# Patient Record
Sex: Female | Born: 1995 | Hispanic: Yes | Marital: Single | Smoking: Current every day smoker
Health system: Southern US, Community
[De-identification: ages and names within clinical notes are randomized; demographics above are authoritative.]

## PROBLEM LIST (undated history)

## (undated) DIAGNOSIS — E119 Type 2 diabetes mellitus without complications: Secondary | ICD-10-CM

## (undated) DIAGNOSIS — J343 Hypertrophy of nasal turbinates: Secondary | ICD-10-CM

## (undated) HISTORY — PX: TURBINATE REDUCTION: SHX6157

## (undated) HISTORY — PX: ADENOIDECTOMY: SUR15

---
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
## 2014-12-11 ENCOUNTER — Ambulatory Visit (INDEPENDENT_AMBULATORY_CARE_PROVIDER_SITE_OTHER): Payer: Medicaid Other | Admitting: Otolaryngology

## 2014-12-11 DIAGNOSIS — J310 Chronic rhinitis: Secondary | ICD-10-CM

## 2015-01-22 ENCOUNTER — Ambulatory Visit (INDEPENDENT_AMBULATORY_CARE_PROVIDER_SITE_OTHER): Payer: Medicaid Other | Admitting: Otolaryngology

## 2015-01-22 DIAGNOSIS — J310 Chronic rhinitis: Secondary | ICD-10-CM

## 2015-09-14 LAB — HEMOGLOBIN A1C: Hemoglobin A1C: 9.2

## 2015-09-15 ENCOUNTER — Other Ambulatory Visit: Payer: Self-pay | Admitting: *Deleted

## 2015-09-15 ENCOUNTER — Telehealth: Payer: Self-pay | Admitting: Family Medicine

## 2015-09-15 ENCOUNTER — Ambulatory Visit (INDEPENDENT_AMBULATORY_CARE_PROVIDER_SITE_OTHER): Payer: Medicaid Other | Admitting: Family Medicine

## 2015-09-15 ENCOUNTER — Encounter: Payer: Self-pay | Admitting: Family Medicine

## 2015-09-15 VITALS — BP 137/92 | HR 81 | Temp 98.3°F | Ht 67.0 in | Wt 294.2 lb

## 2015-09-15 DIAGNOSIS — E119 Type 2 diabetes mellitus without complications: Secondary | ICD-10-CM | POA: Diagnosis not present

## 2015-09-15 DIAGNOSIS — R739 Hyperglycemia, unspecified: Secondary | ICD-10-CM

## 2015-09-15 LAB — URINALYSIS
Bilirubin, UA: NEGATIVE
LEUKOCYTES UA: NEGATIVE
Nitrite, UA: NEGATIVE
PH UA: 5.5 (ref 5.0–7.5)
RBC, UA: NEGATIVE
Specific Gravity, UA: >1.03 — ABNORMAL HIGH (ref 1.005–1.030)
Urobilinogen, Ur: 0.2 mg/dL (ref 0.2–1.0)

## 2015-09-15 LAB — GLUCOSE HEMOCUE WAIVED: Glu Hemocue Waived: 273 mg/dL — ABNORMAL HIGH (ref 65–99)

## 2015-09-15 MED ORDER — INSULIN DEGLUDEC 100 UNIT/ML ~~LOC~~ SOPN: 10.0000 [IU] | PEN_INJECTOR | Freq: Once | SUBCUTANEOUS | Status: DC

## 2015-09-15 MED ORDER — GLUCOSE BLOOD VI STRP: ORAL_STRIP | Status: DC

## 2015-09-15 NOTE — Telephone Encounter (Signed)
Patient given an appointment for today @ 1:55pm. Records requested form [facility].

## 2015-09-15 NOTE — Progress Notes (Signed)
Subjective:  Patient ID: Shelley Baldwin, female    DOB: 1995/09/25  Age: 20 y.o. MRN: [id number]  CC: Hospitalization Follow-up   HPI DENIELLE BAYARD presents forFollow-up of diabetes. Seen in E[facility] At [facility] yesterday and noted to have elevated glucose. Had AbG Showing resp. Acidosis ,and 3+ ketosis noted in urine. Patient never dxed prior. Glucose was 270s. HbA1c was 9.2. She was sent home with scrip for metformin 500 mg BID and ondansetron for nausea.  Pt. Sates she is determined to lose weight and cure her diabetes. To start with this she is going to cut out carb related snacks. She likes sweets and starches as well as rice.     History Roschelle has a past medical history of Nasal turbinate hypertrophy (07/2013).   She has past surgical history that includes Adenoidectomy; Turbinate reduction; and Turbinate reduction (Bilateral, 08/05/2013).   Her family history includes Diabetes in her mother.She reports that she has been smoking Cigarettes.  She has smoked for the past 2 years. She has never used smokeless tobacco. She reports that she drinks alcohol. She reports that she does not use illicit drugs.  No current outpatient prescriptions on file prior to visit.   No current facility-administered medications on file prior to visit.    ROS Review of Systems  Constitutional: Negative for fever, activity change and appetite change.  HENT: Negative for congestion, rhinorrhea and sore throat.   Eyes: Negative for visual disturbance.  Respiratory: Negative for cough and shortness of breath.   Cardiovascular: Negative for chest pain and palpitations.  Gastrointestinal: Negative for nausea, abdominal pain and diarrhea.  Genitourinary: Negative for dysuria.  Musculoskeletal: Negative for myalgias and arthralgias.    Objective:  BP 137/92 mmHg  Pulse 81  Temp(Src) 98.3 F (36.8 C) (Oral)  Ht 5' 7"  (1.702 m)  Wt 294 lb 3.2 oz (133.448 kg)  BMI 46.07 kg/m2  SpO2 99%  LMP  08/04/2015  BP Readings from Last 3 Encounters:  09/15/15 137/92  08/05/13 131/87  05/24/13 119/65    Wt Readings from Last 3 Encounters:  09/15/15 294 lb 3.2 oz (133.448 kg)  08/05/13 243 lb 6.4 oz (110.406 kg) (99 %*, Z = 2.41)  03/29/13 233 lb (105.688 kg) (99 %*, Z = 2.32)   * Growth percentiles are based on CDC 2-20 Years data.     Physical Exam  Constitutional: She is oriented to person, place, and time. She appears well-developed and well-nourished. No distress.  HENT:  Head: Normocephalic and atraumatic.  Right Ear: External ear normal.  Left Ear: External ear normal.  Nose: Nose normal.  Mouth/Throat: Oropharynx is clear and moist.  Eyes: Conjunctivae and EOM are normal. Pupils are equal, round, and reactive to light.  Neck: Normal range of motion. Neck supple. No thyromegaly present.  Cardiovascular: Normal rate, regular rhythm and normal heart sounds.   No murmur heard. Pulmonary/Chest: Effort normal and breath sounds normal. No respiratory distress. She has no wheezes. She has no rales.  Abdominal: Soft. Bowel sounds are normal. She exhibits no distension. There is no tenderness.  Lymphadenopathy:    She has no cervical adenopathy.  Neurological: She is alert and oriented to person, place, and time. She has normal reflexes.  Skin: Skin is warm and dry.  Psychiatric: She has a normal mood and affect. Her behavior is normal. Judgment and thought content normal.    No results found for: HGBA1C  Lab Results  Component Value Date   HGB 14.0 08/05/2013  Assessment & Plan:   Patrcia was seen today for hospitalization follow-up.  Diagnoses and all orders for this visit:  Hyperglycemia -     Glucose Hemocue Waived -     Urinalysis -     CBC with Differential/Platelet -     CMP14+EGFR -     TSH -     Microalbumin / creatinine urine ratio -     C-peptide -     Glucose Hemocue Waived -     Insulin Degludec SOPN 10 Units; Inject 10 Units into the skin  once.  Diabetes mellitus without complication (HCC) -     Insulin Degludec SOPN 10 Units; Inject 10 Units into the skin once.    Results for orders placed or performed in visit on 09/15/15  Glucose Hemocue Waived  Result Value Ref Range   Glu Hemocue Waived 273 (H) 65 - 99 mg/dL  Urinalysis  Result Value Ref Range   Specific Gravity, UA >1.030 (H) 1.005 - 1.030   pH, UA 5.5 5.0 - 7.5   Color, UA Yellow Yellow   Appearance Ur Clear Clear   Leukocytes, UA Negative Negative   Protein, UA 1+ (A) Negative/Trace   Glucose, UA 2+ (A) Negative   Ketones, UA 3+ (A) Negative   RBC, UA Negative Negative   Bilirubin, UA Negative Negative   Urobilinogen, Ur 0.2 0.2 - 1.0 mg/dL   Nitrite, UA Negative Negative     I have discontinued Ms. Heinle's oxyCODONE-acetaminophen. I am also having her maintain her metFORMIN and ondansetron. We will continue to administer Insulin Degludec.  Meds ordered this encounter  Medications  . metFORMIN (GLUCOPHAGE) 500 MG tablet    Sig: Take 500 mg by mouth 2 (two) times daily with a meal.  . ondansetron (ZOFRAN-ODT) 4 MG disintegrating tablet    Sig: Take 4 mg by mouth every 8 (eight) hours as needed for nausea or vomiting.  . Insulin Degludec SOPN 10 Units    Sig:    45 min spent with pt. More than 1/2 of which was in counseling and education regarding DM, weight loss, carb counting and teaching of complex & simple carb  Containing foods. Shewas also instructed on proper glucose monitoring techniques and a monitor was dispensed.  Follow-up: Return in about 7 days (around 09/22/2015).  Claretta Fraise, M.D.

## 2015-09-16 LAB — MICROALBUMIN / CREATININE URINE RATIO
CREATININE, UR: 261.2 mg/dL
MICROALB/CREAT RATIO: 16.2 mg/g creat (ref 0.0–30.0)
MICROALBUM., U, RANDOM: 42.3 ug/mL

## 2015-09-17 ENCOUNTER — Telehealth: Payer: Self-pay | Admitting: Family Medicine

## 2015-09-17 LAB — C-PEPTIDE: C-Peptide: 3.5 ng/mL (ref 1.1–4.4)

## 2015-09-17 LAB — CMP14+EGFR
ALK PHOS: 120 IU/L — AB (ref 39–117)
ALT: 81 IU/L — ABNORMAL HIGH (ref 0–32)
AST: 56 IU/L — AB (ref 0–40)
Albumin/Globulin Ratio: 1.2 (ref 1.2–2.2)
Albumin: 4.1 g/dL (ref 3.5–5.5)
BILIRUBIN TOTAL: 0.4 mg/dL (ref 0.0–1.2)
BUN/Creatinine Ratio: 14 (ref 9–23)
BUN: 10 mg/dL (ref 6–20)
CHLORIDE: 93 mmol/L — AB (ref 96–106)
CO2: 22 mmol/L (ref 18–29)
CREATININE: 0.74 mg/dL (ref 0.57–1.00)
Calcium: 9.6 mg/dL (ref 8.7–10.2)
GFR calc Af Amer: 135 mL/min/{1.73_m2} (ref >59)
GFR calc non Af Amer: 117 mL/min/{1.73_m2} (ref >59)
GLOBULIN, TOTAL: 3.5 g/dL (ref 1.5–4.5)
GLUCOSE: 239 mg/dL — AB (ref 65–99)
Potassium: 3.6 mmol/L (ref 3.5–5.2)
SODIUM: 135 mmol/L (ref 134–144)
Total Protein: 7.6 g/dL (ref 6.0–8.5)

## 2015-09-17 LAB — CBC WITH DIFFERENTIAL/PLATELET
BASOS: 0 %
Basophils Absolute: 0 10*3/uL (ref 0.0–0.2)
EOS (ABSOLUTE): 0.1 10*3/uL (ref 0.0–0.4)
Eos: 2 %
Hematocrit: 39.8 % (ref 34.0–46.6)
Hemoglobin: 13.7 g/dL (ref 11.1–15.9)
IMMATURE GRANS (ABS): 0 10*3/uL (ref 0.0–0.1)
Immature Granulocytes: 0 %
Lymphocytes Absolute: 2.1 10*3/uL (ref 0.7–3.1)
Lymphs: 40 %
MCH: 29.5 pg (ref 26.6–33.0)
MCHC: 34.4 g/dL (ref 31.5–35.7)
MCV: 86 fL (ref 79–97)
MONOCYTES: 7 %
Monocytes Absolute: 0.4 10*3/uL (ref 0.1–0.9)
NEUTROS ABS: 2.7 10*3/uL (ref 1.4–7.0)
Neutrophils: 51 %
Platelets: 217 10*3/uL (ref 150–379)
RBC: 4.65 x10E6/uL (ref 3.77–5.28)
RDW: 13.5 % (ref 12.3–15.4)
WBC: 5.2 10*3/uL (ref 3.4–10.8)

## 2015-09-17 LAB — TSH: TSH: 2.18 u[IU]/mL (ref 0.450–4.500)

## 2015-09-17 MED ORDER — METFORMIN HCL 500 MG PO TABS: 500.0000 mg | ORAL_TABLET | Freq: Two times a day (BID) | ORAL | Status: DC

## 2015-09-17 MED ORDER — GLUCOSE BLOOD VI STRP: ORAL_STRIP | Status: DC

## 2015-09-17 NOTE — Telephone Encounter (Addendum)
Patient aware that I resent rx for metformin and strips to the pharmacy for her.

## 2015-09-19 ENCOUNTER — Telehealth: Payer: Self-pay | Admitting: Family Medicine

## 2015-09-19 ENCOUNTER — Other Ambulatory Visit: Payer: Self-pay

## 2015-09-19 MED ORDER — GLUCOSE BLOOD VI STRP: ORAL_STRIP | Status: DC

## 2015-09-19 MED ORDER — ACCU-CHEK MULTICLIX LANCETS MISC: Status: DC

## 2015-09-19 NOTE — Telephone Encounter (Signed)
done

## 2015-09-21 ENCOUNTER — Encounter: Payer: Self-pay | Admitting: *Deleted

## 2015-09-22 ENCOUNTER — Ambulatory Visit: Payer: Medicaid Other | Admitting: Family Medicine

## 2015-09-23 ENCOUNTER — Encounter: Payer: Self-pay | Admitting: Family Medicine

## 2015-09-24 ENCOUNTER — Ambulatory Visit (INDEPENDENT_AMBULATORY_CARE_PROVIDER_SITE_OTHER): Payer: Medicaid Other | Admitting: Family Medicine

## 2015-09-24 ENCOUNTER — Encounter: Payer: Self-pay | Admitting: Family Medicine

## 2015-09-24 VITALS — BP 131/75 | HR 83 | Temp 98.1°F | Ht 67.0 in | Wt 290.2 lb

## 2015-09-24 DIAGNOSIS — IMO0001 Reserved for inherently not codable concepts without codable children: Secondary | ICD-10-CM

## 2015-09-24 DIAGNOSIS — E1165 Type 2 diabetes mellitus with hyperglycemia: Secondary | ICD-10-CM | POA: Diagnosis not present

## 2015-09-24 LAB — GLUCOSE HEMOCUE WAIVED: Glu Hemocue Waived: 280 mg/dL — ABNORMAL HIGH (ref 65–99)

## 2015-09-24 MED ORDER — METFORMIN HCL 850 MG PO TABS: 850.0000 mg | ORAL_TABLET | Freq: Two times a day (BID) | ORAL | Status: DC

## 2015-09-24 MED ORDER — METFORMIN HCL ER 750 MG PO TB24: 750.0000 mg | ORAL_TABLET | Freq: Two times a day (BID) | ORAL | Status: DC

## 2015-09-24 NOTE — Addendum Note (Signed)
Addended by: Mechele ClaudeSTACKS, Kostantinos Tallman on: 09/24/2015 01:06 PM   Modules accepted: Orders, Medications

## 2015-09-24 NOTE — Progress Notes (Addendum)
Subjective:  Patient ID: Shelley Baldwin, female    DOB: 1996/03/03  Age: 20 y.o. MRN: [id number]  CC: Diabetes   HPI TERIANA DANKER presents forFollow-up of diabetes. Patient checks blood sugar at home.   270 fasting and 370 postprandial Patient denies symptoms such as polyuria, polydipsia, excessive hunger, nausea No significant hypoglycemic spells noted. Medications as noted below. Taking them regularlyOnly adverse effect is multiple loose BM daily.  History Pessy has a past medical history of Nasal turbinate hypertrophy (07/2013).   She has past surgical history that includes Adenoidectomy; Turbinate reduction; and Turbinate reduction (Bilateral, 08/05/2013).   Her family history includes Diabetes in her mother.She reports that she has been smoking Cigarettes.  She has smoked for the past 2 years. She has never used smokeless tobacco. She reports that she drinks alcohol. She reports that she does not use illicit drugs.  Current Outpatient Prescriptions on File Prior to Visit  Medication Sig Dispense Refill  . glucose blood test strip Check blood sugar one time daily and as needed 100 each 12  . Lancets (ACCU-CHEK MULTICLIX) lancets Use as instructed 100 each 2  . ondansetron (ZOFRAN-ODT) 4 MG disintegrating tablet Take 4 mg by mouth every 8 (eight) hours as needed for nausea or vomiting.    Marland Kitchen glucose blood (ACCU-CHEK GUIDE) test strip Use as instructed 100 each 2   Current Facility-Administered Medications on File Prior to Visit  Medication Dose Route Frequency Provider Last Rate Last Dose  . Insulin Degludec SOPN 10 Units  10 Units Subcutaneous Once Mechele Claude, MD        ROS Review of Systems  Constitutional: Negative for fever, activity change and appetite change.  HENT: Negative for congestion, rhinorrhea and sore throat.   Eyes: Negative for visual disturbance.  Respiratory: Negative for cough and shortness of breath.   Cardiovascular: Negative for chest pain and  palpitations.  Gastrointestinal: Negative for nausea, abdominal pain and diarrhea.  Genitourinary: Negative for dysuria.  Musculoskeletal: Negative for myalgias and arthralgias.    Objective:  BP 131/75 mmHg  Pulse 83  Temp(Src) 98.1 F (36.7 C) (Oral)  Ht  (1.702 m)  Wt 290 lb 3.2 oz (131.634 kg)  BMI 45.44 kg/m2  SpO2 98%  LMP 08/04/2015  BP Readings from Last 3 Encounters:  09/24/15 131/75  09/15/15 137/92  08/05/13 131/87    Wt Readings from Last 3 Encounters:  09/24/15 290 lb 3.2 oz (131.634 kg)  09/15/15 294 lb 3.2 oz (133.448 kg)  08/05/13 243 lb 6.4 oz (110.406 kg) (99 %*, Z = 2.41)   * Growth percentiles are based on CDC 2-20 Years data.     Physical Exam  Constitutional: She is oriented to person, place, and time. She appears well-developed and well-nourished. No distress.  HENT:  Head: Normocephalic and atraumatic.  Eyes: Conjunctivae are normal. Pupils are equal, round, and reactive to light.  Neck: Normal range of motion. Neck supple. No thyromegaly present.  Cardiovascular: Normal rate, regular rhythm and normal heart sounds.   No murmur heard. Pulmonary/Chest: Effort normal and breath sounds normal. No respiratory distress. She has no wheezes. She has no rales.  Abdominal: Soft. Bowel sounds are normal. She exhibits no distension. There is no tenderness.  Musculoskeletal: Normal range of motion.  Lymphadenopathy:    She has no cervical adenopathy.  Neurological: She is alert and oriented to person, place, and time.  Skin: Skin is warm and dry.  Psychiatric: She has a normal mood and  affect. Her behavior is normal. Judgment and thought content normal.    Lab Results  Component Value Date   HGBA1C 9.2 09/14/2015    Lab Results  Component Value Date   WBC 5.2 09/15/2015   HGB 14.0 08/05/2013   HCT 39.8 09/15/2015   PLT 217 09/15/2015   GLUCOSE 239* 09/15/2015   ALT 81* 09/15/2015   AST 56* 09/15/2015   NA 135 09/15/2015   K 3.6  09/15/2015   CL 93* 09/15/2015   CREATININE 0.74 09/15/2015   BUN 10 09/15/2015   CO2 22 09/15/2015   TSH 2.180 09/15/2015   HGBA1C 9.2 09/14/2015     Assessment & Plan:   [name] was seen today for diabetes.  Diagnoses and all orders for this visit:  Uncontrolled type 2 diabetes mellitus without complication, without long-term current use of insulin (HCC) -     Glucose Hemocue Waived  Other orders -     Discontinue: metFORMIN (GLUCOPHAGE) 850 MG tablet; Take 1 tablet (850 mg total) by mouth 2 (two) times daily with a meal. -     metFORMIN (GLUCOPHAGE-XR) 750 MG 24 hr tablet; Take 1 tablet (750 mg total) by mouth 2 (two) times daily.    Continue low carb "Lean and green" diet.  I have discontinued Ms. Cuny's metFORMIN and metFORMIN. I am also having her start on metFORMIN. Additionally, I am having her maintain her ondansetron, glucose blood, glucose blood, and accu-chek multiclix. We will continue to administer Insulin Degludec.  Meds ordered this encounter  Medications  . DISCONTD: metFORMIN (GLUCOPHAGE) 850 MG tablet    Sig: Take 1 tablet (850 mg total) by mouth 2 (two) times daily with a meal.    Dispense:  60 tablet    Refill:  2  . metFORMIN (GLUCOPHAGE-XR) 750 MG 24 hr tablet    Sig: Take 1 tablet (750 mg total) by mouth 2 (two) times daily.    Dispense:  60 tablet    Refill:  5    Replaces recently submitted scrip for Metformin 850 BID due to pt report of diarrhea     Follow-up: Return in about 1 week (around 10/01/2015).  Mechele ClaudeWarren Estefania Kamiya, M.D.

## 2015-10-05 ENCOUNTER — Ambulatory Visit: Payer: Medicaid Other | Admitting: Family Medicine

## 2015-10-08 ENCOUNTER — Ambulatory Visit: Payer: Medicaid Other | Admitting: Pediatrics

## 2015-10-09 ENCOUNTER — Encounter: Payer: Self-pay | Admitting: Family Medicine

## 2015-10-09 ENCOUNTER — Ambulatory Visit (INDEPENDENT_AMBULATORY_CARE_PROVIDER_SITE_OTHER): Payer: Medicaid Other | Admitting: Pediatrics

## 2015-10-09 ENCOUNTER — Encounter: Payer: Self-pay | Admitting: Pediatrics

## 2015-10-09 VITALS — BP 136/88 | HR 101 | Temp 98.5°F | Ht 67.0 in | Wt 284.8 lb

## 2015-10-09 DIAGNOSIS — Z975 Presence of (intrauterine) contraceptive device: Secondary | ICD-10-CM | POA: Diagnosis not present

## 2015-10-09 DIAGNOSIS — Z309 Encounter for contraceptive management, unspecified: Secondary | ICD-10-CM | POA: Diagnosis not present

## 2015-10-09 DIAGNOSIS — R030 Elevated blood-pressure reading, without diagnosis of hypertension: Secondary | ICD-10-CM

## 2015-10-09 DIAGNOSIS — E119 Type 2 diabetes mellitus without complications: Secondary | ICD-10-CM | POA: Diagnosis not present

## 2015-10-09 DIAGNOSIS — IMO0001 Reserved for inherently not codable concepts without codable children: Secondary | ICD-10-CM

## 2015-10-09 DIAGNOSIS — Z3009 Encounter for other general counseling and advice on contraception: Secondary | ICD-10-CM

## 2015-10-09 MED ORDER — SITAGLIPTIN PHOSPHATE 100 MG PO TABS: 100.0000 mg | ORAL_TABLET | Freq: Every day | ORAL | Status: DC

## 2015-10-09 NOTE — Patient Instructions (Signed)
 1000mg  metformin twice a day  100mg  of januvia once a day in the morning  Check  blood glucose level in the morning before eating anything and before dinner.

## 2015-10-09 NOTE — Progress Notes (Signed)
    Subjective:    Patient ID: Shelley Baldwin, female    DOB: 29-Mar-1996, 20 y.o.   MRN: [id number]  CC: Follow-up diabetes  HPI: Shelley Baldwin is a 20 y.o. female presenting for Follow-up  DM2: started on metformin 5-6 weeks ago.  Range from 200s-300s. No lows Feeling well Checks BGL multiple times a day  Interested in losing weight Has been making lifestyle changes such as increased activity, cutting out sugary beverages  On birth control, mirena IUD, needs to have replaced this fall. Dec 2013 was placed.   [facility] 10/09/2015 09/24/2015 09/15/2015  Decreased Interest 0 0 0  Down, Depressed, Hopeless 0 0 0  PHQ - 2 Score 0 0 0     Relevant past medical, surgical, family and social history reviewed and updated as indicated.  Interim medical history since our last visit reviewed. Allergies and medications reviewed and updated.  ROS: Per HPI unless specifically indicated above  History  Smoking status  . Current Every Day Smoker -- 2 years  . Types: Cigarettes  Smokeless tobacco  . Never Used    Comment: 5-6 cig./day       Objective:    BP 136/88 mmHg  Pulse 101  Temp(Src) 98.5 F (36.9 C) (Oral)  Ht 5\' 7"  (1.702 m)  Wt 284 lb 12.8 oz (129.184 kg)  BMI 44.60 kg/m2  LMP 08/04/2015  Wt Readings from Last 3 Encounters:  10/09/15 284 lb 12.8 oz (129.184 kg)  09/24/15 290 lb 3.2 oz (131.634 kg)  09/15/15 294 lb 3.2 oz (133.448 kg)     Gen: NAD, alert, cooperative with exam, NCAT EYES: EOMI, no scleral injection or icterus CV: NRRR, normal S1/S2, no murmur, distal pulses 2+ b/l Resp: CTABL, no wheezes, normal WOB Ext: No edema, warm Neuro: Alert and oriented      Assessment & Plan:    [name] was seen today for follow-up multiple med problems.  Diagnoses and all orders for this visit:  Type 2 diabetes mellitus without complication, without long-term current use of insulin (HCC) BGLs still elevated, none below 250 at home Start  below Discussed needs to talk with doctors before trying to get pregnant If still not controlled with below and metformin 1000mg  BID, will ass basal insulin -     sitaGLIPtin (JANUVIA) 100 MG tablet; Take 1 tablet (100 mg total) by mouth daily.  Elevated blood pressure Will recheck next visit Trying lifestyle changes Start med next visit if needed  Morbid obesity, unspecified obesity type (HCC) Cont lifestyle changes  Birth control counseling Completed, given new diagnosis, new meds  IUD (intrauterine device) in place Getting replaced in next few months    Follow up plan: Return in about 2 weeks (around 10/23/2015).  Rex Krasarol Corby Vandenberghe, MD [facility] 10/09/2015, 4:46 PM

## 2015-10-12 DIAGNOSIS — R030 Elevated blood-pressure reading, without diagnosis of hypertension: Secondary | ICD-10-CM

## 2015-10-12 DIAGNOSIS — E119 Type 2 diabetes mellitus without complications: Secondary | ICD-10-CM | POA: Insufficient documentation

## 2015-10-12 DIAGNOSIS — Z975 Presence of (intrauterine) contraceptive device: Secondary | ICD-10-CM | POA: Insufficient documentation

## 2015-10-12 DIAGNOSIS — IMO0001 Reserved for inherently not codable concepts without codable children: Secondary | ICD-10-CM | POA: Insufficient documentation

## 2015-10-12 DIAGNOSIS — Z720 Tobacco use: Secondary | ICD-10-CM | POA: Insufficient documentation

## 2015-10-26 ENCOUNTER — Ambulatory Visit: Payer: Medicaid Other | Admitting: Pediatrics

## 2015-11-02 ENCOUNTER — Encounter: Payer: Self-pay | Admitting: Pediatrics

## 2015-11-02 ENCOUNTER — Ambulatory Visit (INDEPENDENT_AMBULATORY_CARE_PROVIDER_SITE_OTHER): Payer: Medicaid Other | Admitting: Pediatrics

## 2015-11-02 VITALS — BP 141/89 | HR 101 | Temp 98.5°F | Ht 67.0 in | Wt 277.0 lb

## 2015-11-02 DIAGNOSIS — Z720 Tobacco use: Secondary | ICD-10-CM | POA: Diagnosis not present

## 2015-11-02 DIAGNOSIS — E119 Type 2 diabetes mellitus without complications: Secondary | ICD-10-CM | POA: Diagnosis not present

## 2015-11-02 DIAGNOSIS — R030 Elevated blood-pressure reading, without diagnosis of hypertension: Secondary | ICD-10-CM | POA: Diagnosis not present

## 2015-11-02 DIAGNOSIS — IMO0001 Reserved for inherently not codable concepts without codable children: Secondary | ICD-10-CM

## 2015-11-02 LAB — BAYER DCA HB A1C WAIVED: HB A1C (BAYER DCA - WAIVED): 11.9 % — ABNORMAL HIGH (ref <7.0)

## 2015-11-02 MED ORDER — INSULIN PEN NEEDLE 31G X 4 MM MISC: 1.0000 "application " | Freq: Every day | 1 refills | Status: DC

## 2015-11-02 MED ORDER — INSULIN GLARGINE 100 UNITS/ML SOLOSTAR PEN: 10.0000 [IU] | PEN_INJECTOR | Freq: Every day | SUBCUTANEOUS | Status: DC

## 2015-11-02 NOTE — Progress Notes (Signed)
    Subjective:    Patient ID: Shelley Baldwin, female    DOB: 05/02/1995, 20 y.o.   MRN: [id number]  CC: Diabetes (2 week rck)   HPI: Shelley Baldwin is a 20 y.o. female presenting for Diabetes (2 week rck)  300s in the morning 250-310 in the morning 1000mg  metformin BID januvia 100mg  in the morning No side effects   No headaches or vision changes Has given insulin to family members before Feeling better, more energy, than a few weeks ago  Continues to smoke regularly   Depression screen [facility] 2/9 11/02/2015 10/09/2015 09/24/2015 09/15/2015  Decreased Interest 0 0 0 0  Down, Depressed, Hopeless 0 0 0 0  PHQ - 2 Score 0 0 0 0     Relevant past medical, surgical, family and social history reviewed and updated as indicated.  Interim medical history since our last visit reviewed. Allergies and medications reviewed and updated.  ROS: Per HPI   History  Smoking Status  . Current Every Day Smoker  . Years: 2.00  . Types: Cigarettes  Smokeless Tobacco  . Never Used    Comment: 5-6 cig./day       Objective:    BP (!) 141/89 (BP Location: Right Arm, Patient Position: Sitting, Cuff Size: Large)   Pulse (!) 101   Temp 98.5 F (36.9 C) (Oral)   Ht 5\' 7"  (1.702 m)   Wt 277 lb (125.6 kg)   BMI 43.38 kg/m   Wt Readings from Last 3 Encounters:  11/02/15 277 lb (125.6 kg)  10/09/15 284 lb 12.8 oz (129.2 kg)  09/24/15 290 lb 3.2 oz (131.6 kg)     Gen: NAD, alert, cooperative with exam, NCAT EYES: EOMI, no scleral injection or icterus ENT:  OP without erythema CV: NRRR, normal S1/S2, no murmur, distal pulses 2+ b/l Resp: CTABL, no wheezes, normal WOB Ext: No edema, warm Neuro: Alert and oriented     Assessment & Plan:    Shelley Baldwin was seen today for diabetes.  Diagnoses and all orders for this visit:  Type 2 diabetes mellitus without complication, without long-term current use of insulin (HCC) HgA1c increased to 11 C-peptide 3.5, anti-gad not checked, will send Start  basal insulin Cont metformin, januvia Let me know if AM BGLs less than 100 Check at least before breakfast and dinner Bring to next clinic visit -     Bayer DCA Hb A1c Waived -     insulin glargine (LANTUS) Solostar Pen 10 Units; Inject 0.1 mLs (10 Units total) into the skin daily at 10 pm. -     Insulin Pen Needle (EXEL COMFORT POINT PEN NEEDLE) 31G X 4 MM MISC; 1 application by Does not apply route daily.  Elevated blood pressure Take BP at home  Continue to work on lifestyle changes Will need to start medicine if remains elevated  Tobacco abuse Continue to encourage cessation  BMI 43 Continue to decrease sugar intake, sodas Increase physical activity  Follow up plan: Return for 4 weeks appt with Tammy, 3 mo with MD.  Rex Kras, MD [facility] 11/02/2015, 5:03 PM

## 2015-11-04 ENCOUNTER — Telehealth: Payer: Self-pay | Admitting: Family Medicine

## 2015-11-05 ENCOUNTER — Other Ambulatory Visit: Payer: Medicaid Other

## 2015-11-05 ENCOUNTER — Other Ambulatory Visit: Payer: Self-pay | Admitting: Pediatrics

## 2015-11-05 DIAGNOSIS — E119 Type 2 diabetes mellitus without complications: Secondary | ICD-10-CM

## 2015-11-05 MED ORDER — INSULIN GLARGINE 100 UNIT/ML SOLOSTAR PEN: 10.0000 [IU] | PEN_INJECTOR | Freq: Every day | SUBCUTANEOUS | 2 refills | Status: DC

## 2015-11-05 NOTE — Telephone Encounter (Signed)
Fixed Rx problem. Pt coming by today for a lab draw, order in.

## 2015-11-06 ENCOUNTER — Other Ambulatory Visit: Payer: Self-pay | Admitting: Pediatrics

## 2015-11-06 DIAGNOSIS — E119 Type 2 diabetes mellitus without complications: Secondary | ICD-10-CM

## 2015-11-06 LAB — GLUTAMIC ACID DECARBOXYLASE AUTO ABS: Glutamic Acid Decarb Ab: <5 U/mL (ref 0.0–5.0)

## 2015-12-22 ENCOUNTER — Encounter (HOSPITAL_COMMUNITY): Payer: Self-pay | Admitting: Emergency Medicine

## 2015-12-22 ENCOUNTER — Emergency Department (HOSPITAL_COMMUNITY): Payer: Medicaid Other

## 2015-12-22 ENCOUNTER — Emergency Department (HOSPITAL_COMMUNITY)
Admission: EM | Admit: 2015-12-22 | Discharge: 2015-12-22 | Disposition: A | Discharge status: Home / Self Care | Payer: Medicaid Other | Attending: Emergency Medicine | Admitting: Emergency Medicine

## 2015-12-22 DIAGNOSIS — Z7984 Long term (current) use of oral hypoglycemic drugs: Secondary | ICD-10-CM | POA: Diagnosis not present

## 2015-12-22 DIAGNOSIS — E119 Type 2 diabetes mellitus without complications: Secondary | ICD-10-CM | POA: Diagnosis not present

## 2015-12-22 DIAGNOSIS — F17210 Nicotine dependence, cigarettes, uncomplicated: Secondary | ICD-10-CM | POA: Insufficient documentation

## 2015-12-22 DIAGNOSIS — J208 Acute bronchitis due to other specified organisms: Secondary | ICD-10-CM | POA: Diagnosis not present

## 2015-12-22 DIAGNOSIS — R05 Cough: Secondary | ICD-10-CM | POA: Diagnosis present

## 2015-12-22 DIAGNOSIS — Z794 Long term (current) use of insulin: Secondary | ICD-10-CM | POA: Diagnosis not present

## 2015-12-22 HISTORY — DX: Type 2 diabetes mellitus without complications: E11.9

## 2015-12-22 MED ORDER — AZITHROMYCIN 250 MG PO TABS: 250.0000 mg | ORAL_TABLET | Freq: Every day | ORAL | 0 refills | Status: DC

## 2015-12-22 MED ORDER — IPRATROPIUM-ALBUTEROL 0.5-2.5 (3) MG/3ML IN SOLN
3.0000 mL | Freq: Once | RESPIRATORY_TRACT | Status: AC
  Administered 2015-12-22: 3 mL via RESPIRATORY_TRACT
  Filled 2015-12-22: qty 3

## 2015-12-22 MED ORDER — ALBUTEROL SULFATE HFA 108 (90 BASE) MCG/ACT IN AERS
2.0000 | INHALATION_SPRAY | RESPIRATORY_TRACT | Status: DC
  Administered 2015-12-22: 2 via RESPIRATORY_TRACT
  Filled 2015-12-22: qty 6.7

## 2015-12-22 NOTE — ED Notes (Signed)
Rt at the bedside to give treatment and education done on inhaler

## 2015-12-22 NOTE — ED Provider Notes (Signed)
[facility]ER[facility]Y DEPT Provider Note   CSN: [id number] Arrival date & time: 12/22/15  1050     History   Chief Complaint Chief Complaint  Patient presents with  . Cough    HPI Shelley Baldwin is a 20 y.o. female.  The history is provided by the patient. No language interpreter was used.  Cough  This is a recurrent problem. The problem occurs constantly. The problem has been gradually worsening. The cough is productive of sputum. There has been no fever. Associated symptoms include sore throat. She has tried nothing for the symptoms. The treatment provided no relief. She is a smoker. Her past medical history is significant for bronchitis.    Past Medical History:  Diagnosis Date  . Diabetes mellitus without complication (HCC)   . Nasal turbinate hypertrophy 07/2013    Patient Active Problem List   Diagnosis Date Noted  . Type 2 diabetes mellitus without complication, without long-term current use of insulin (HCC) 10/12/2015  . Elevated blood pressure 10/12/2015  . Morbid obesity (HCC) 10/12/2015  . IUD (intrauterine device) in place 10/12/2015  . Tobacco abuse 10/12/2015    Past Surgical History:  Procedure Laterality Date  . ADENOIDECTOMY    . TURBINATE REDUCTION     x 3, per mother  . TURBINATE REDUCTION Bilateral 08/05/2013   Procedure: BILATERAL TURBINATE REDUCTION;  Surgeon: Darletta Moll, MD;  Location: [facility];  Service: ENT;  Laterality: Bilateral;    OB History    No data available       Home Medications    Prior to Admission medications   Medication Sig Start Date End Date Taking? Authorizing Provider  glucose blood (ACCU-CHEK GUIDE) test strip Use as instructed 09/19/15   Mechele Claude, MD  glucose blood test strip Check blood sugar one time daily and as needed 09/17/15   Mechele Claude, MD  Insulin Glargine (LANTUS SOLOSTAR) 100 UNIT/ML Solostar Pen Inject 10 Units into the skin daily at 10 pm. 11/05/15   Johna Sheriff, MD  Insulin Pen  Needle (EXEL COMFORT POINT PEN NEEDLE) 31G X 4 MM MISC 1 application by Does not apply route daily. 11/02/15   Johna Sheriff, MD  JANUVIA 100 MG tablet TAKE 1 TABLET (100 MG TOTAL) BY MOUTH DAILY. 11/06/15   Johna Sheriff, MD  Lancets (ACCU-CHEK MULTICLIX) lancets Use as instructed 09/19/15   Mechele Claude, MD  metFORMIN (GLUCOPHAGE) 500 MG tablet Take 1,000 mg by mouth 2 (two) times daily with a meal.  09/14/15   Historical Provider, MD  ondansetron (ZOFRAN-ODT) 4 MG disintegrating tablet Take 4 mg by mouth every 8 (eight) hours as needed for nausea or vomiting.    Historical Provider, MD    Family History Family History  Problem Relation Age of Onset  . Diabetes Mother     Social History Social History  Substance Use Topics  . Smoking status: Current Every Day Smoker    Years: 2.00    Types: Cigarettes  . Smokeless tobacco: Never Used     Comment: 5-6 cig./day  . Alcohol use Yes     Comment: occasionally     Allergies   Review of patient's allergies indicates no known allergies.   Review of Systems Review of Systems  HENT: Positive for sore throat.   Respiratory: Positive for cough.   All other systems reviewed and are negative.    Physical Exam Updated Vital Signs BP 155/90 (BP Location: Left Arm)   Pulse 99  Temp 100 F (37.8 C) (Temporal)   Resp 20   Ht 5\' 7"  (1.702 m)   Wt 122.5 kg   SpO2 100%   BMI 42.29 kg/m   Physical Exam  Constitutional: She appears well-developed and well-nourished. No distress.  HENT:  Head: Normocephalic and atraumatic.  Eyes: Conjunctivae are normal.  Neck: Neck supple.  Cardiovascular: Normal rate and regular rhythm.   No murmur heard. Pulmonary/Chest: Effort normal. No respiratory distress. She has wheezes.  Abdominal: Soft. There is no tenderness.  Musculoskeletal: She exhibits no edema.  Neurological: She is alert.  Skin: Skin is warm and dry.  Psychiatric: She has a normal mood and affect.  Nursing note and vitals  reviewed.    ED Treatments / Results  Labs (all labs ordered are listed, but only abnormal results are displayed) Labs Reviewed - No data to display  EKG  EKG Interpretation None       Radiology Dg Chest 2 View  Result Date: 12/22/2015 CLINICAL DATA:  20 year old female with cough for 1 week. Initial encounter. EXAM: CHEST  2 VIEW COMPARISON:  None. FINDINGS: The heart size and mediastinal contours are within normal limits. Both lungs are clear. Minimal curvature thoracic spine. IMPRESSION: No active cardiopulmonary disease. Electronically Signed   By: Lacy DuverneySteven  Olson M.D.   On: 12/22/2015 11:49    Procedures Procedures (including critical care time)  Medications Ordered in ED Medications  albuterol (PROVENTIL HFA;VENTOLIN HFA) 108 (90 Base) MCG/ACT inhaler 2 puff (2 puffs Inhalation Given 12/22/15 1202)  ipratropium-albuterol (DUONEB) 0.5-2.5 (3) MG/3ML nebulizer solution 3 mL (3 mLs Nebulization Given 12/22/15 1202)     Initial Impression / Assessment and Plan / ED Course  I have reviewed the triage vital signs and the nursing notes.  Pertinent labs & imaging results that were available during my care of the patient were reviewed by me and considered in my medical decision making (see chart for details).  Clinical Course    Chest xray no pneumonia.  Pt given albuterol inhaler.  rx for zithromax  Final Clinical Impressions(s) / ED Diagnoses   Final diagnoses:  Acute bronchitis due to other specified organisms    New Prescriptions New Prescriptions   No medications on file  An After Visit Summary was printed and given to the patient.   Lonia SkinnerLeslie K CourtlandSofia, PA-C 12/22/15 1246    Mancel BaleElliott Wentz, MD 12/22/15 1537

## 2015-12-22 NOTE — ED Triage Notes (Signed)
Pt c/o productive cough with light green sputum x 1 week. Pt c/o feeling worse and increasing sob today. Nad noted.

## 2015-12-22 NOTE — ED Notes (Signed)
Patient given discharge instruction, verbalized understand. Patient ambulatory out of the department.  

## 2015-12-27 ENCOUNTER — Encounter (HOSPITAL_COMMUNITY): Payer: Self-pay | Admitting: Emergency Medicine

## 2015-12-27 ENCOUNTER — Emergency Department (HOSPITAL_COMMUNITY)
Admission: EM | Admit: 2015-12-27 | Discharge: 2015-12-27 | Disposition: A | Discharge status: Home / Self Care | Payer: Medicaid Other | Attending: Emergency Medicine | Admitting: Emergency Medicine

## 2015-12-27 ENCOUNTER — Emergency Department (HOSPITAL_COMMUNITY): Payer: Medicaid Other

## 2015-12-27 DIAGNOSIS — F17210 Nicotine dependence, cigarettes, uncomplicated: Secondary | ICD-10-CM | POA: Insufficient documentation

## 2015-12-27 DIAGNOSIS — Z7984 Long term (current) use of oral hypoglycemic drugs: Secondary | ICD-10-CM | POA: Insufficient documentation

## 2015-12-27 DIAGNOSIS — R0602 Shortness of breath: Secondary | ICD-10-CM | POA: Diagnosis present

## 2015-12-27 DIAGNOSIS — Z794 Long term (current) use of insulin: Secondary | ICD-10-CM | POA: Insufficient documentation

## 2015-12-27 DIAGNOSIS — Z79899 Other long term (current) drug therapy: Secondary | ICD-10-CM | POA: Diagnosis not present

## 2015-12-27 DIAGNOSIS — J40 Bronchitis, not specified as acute or chronic: Secondary | ICD-10-CM | POA: Insufficient documentation

## 2015-12-27 DIAGNOSIS — E119 Type 2 diabetes mellitus without complications: Secondary | ICD-10-CM | POA: Diagnosis not present

## 2015-12-27 MED ORDER — PREDNISONE 10 MG PO TABS: 40.0000 mg | ORAL_TABLET | Freq: Every day | ORAL | 0 refills | Status: DC

## 2015-12-27 MED ORDER — CETIRIZINE HCL 10 MG PO TABS
10.0000 mg | ORAL_TABLET | Freq: Every day | ORAL | 3 refills | Status: DC
Start: 2015-12-27 — End: 2016-12-20

## 2015-12-27 NOTE — ED Provider Notes (Signed)
[facility] Provider Note   CSN: [id number] Arrival date & time: 12/27/15  1218     History   Chief Complaint Chief Complaint  Patient presents with  . Shortness of Breath    HPI Shelley Baldwin is a 20 y.o. female.  Patient seen 1 week ago with diagnosis of bronchitis started azithromycin and albuterol inhaler. Patient with persistent symptoms. Patient states that she's had trouble with cough and this time the cough is occasionally productive on and off since childhood. Has had several turbinate surgeries. Most recently 2015 by ear nose and throat doctor Teoh. Patient states that the treatments from a week ago have not helped at all. Patient denies any fevers. Denies a history of asthma or wheezing.      Past Medical History:  Diagnosis Date  . Diabetes mellitus without complication (HCC)   . Nasal turbinate hypertrophy 07/2013    Patient Active Problem List   Diagnosis Date Noted  . Type 2 diabetes mellitus without complication, without long-term current use of insulin (HCC) 10/12/2015  . Elevated blood pressure 10/12/2015  . Morbid obesity (HCC) 10/12/2015  . IUD (intrauterine device) in place 10/12/2015  . Tobacco abuse 10/12/2015    Past Surgical History:  Procedure Laterality Date  . ADENOIDECTOMY    . TURBINATE REDUCTION     x 3, per mother  . TURBINATE REDUCTION Bilateral 08/05/2013   Procedure: BILATERAL TURBINATE REDUCTION;  Surgeon: Darletta Moll, MD;  Location: [facility];  Service: ENT;  Laterality: Bilateral;    OB History    No data available       Home Medications    Prior to Admission medications   Medication Sig Start Date End Date Taking? Authorizing Provider  azithromycin (ZITHROMAX) 250 MG tablet Take 1 tablet (250 mg total) by mouth daily. Take first 2 tablets together, then 1 every day until finished. 12/22/15   Elson Areas, PA-C  cetirizine (ZYRTEC) 10 MG tablet Take 1 tablet (10 mg total) by mouth daily. 12/27/15    Vanetta Mulders, MD  glucose blood (ACCU-CHEK GUIDE) test strip Use as instructed 09/19/15   Mechele Claude, MD  glucose blood test strip Check blood sugar one time daily and as needed 09/17/15   Mechele Claude, MD  Insulin Glargine (LANTUS SOLOSTAR) 100 UNIT/ML Solostar Pen Inject 10 Units into the skin daily at 10 pm. 11/05/15   Johna Sheriff, MD  Insulin Pen Needle (EXEL COMFORT POINT PEN NEEDLE) 31G X 4 MM MISC 1 application by Does not apply route daily. 11/02/15   Johna Sheriff, MD  JANUVIA 100 MG tablet TAKE 1 TABLET (100 MG TOTAL) BY MOUTH DAILY. 11/06/15   Johna Sheriff, MD  Lancets (ACCU-CHEK MULTICLIX) lancets Use as instructed 09/19/15   Mechele Claude, MD  metFORMIN (GLUCOPHAGE) 500 MG tablet Take 1,000 mg by mouth 2 (two) times daily with a meal.  09/14/15   Historical Provider, MD  ondansetron (ZOFRAN-ODT) 4 MG disintegrating tablet Take 4 mg by mouth every 8 (eight) hours as needed for nausea or vomiting.    Historical Provider, MD  predniSONE (DELTASONE) 10 MG tablet Take 4 tablets (40 mg total) by mouth daily. 12/27/15   Vanetta Mulders, MD    Family History Family History  Problem Relation Age of Onset  . Diabetes Mother     Social History Social History  Substance Use Topics  . Smoking status: Current Every Day Smoker    Years: 2.00    Types:  Cigarettes  . Smokeless tobacco: Never Used     Comment: 5-6 cig./day  . Alcohol use Yes     Comment: occasionally     Allergies   Review of patient's allergies indicates no known allergies.   Review of Systems Review of Systems  Constitutional: Negative for fever.  HENT: Positive for congestion.   Eyes: Negative for itching.  Respiratory: Positive for cough. Negative for shortness of breath and wheezing.   Cardiovascular: Negative for chest pain.  Gastrointestinal: Negative for abdominal pain.  Genitourinary: Negative for dysuria.  Musculoskeletal: Negative for back pain.  Skin: Negative for rash.  Neurological:  Negative for headaches.  Hematological: Does not bruise/bleed easily.  Psychiatric/Behavioral: Negative for confusion.     Physical Exam Updated Vital Signs BP 134/81 (BP Location: Right Arm)   Pulse 82   Temp 98.2 F (36.8 C) (Oral)   Resp 18   Ht 5\' 7"  (1.702 m)   Wt 122.5 kg   SpO2 98%   BMI 42.29 kg/m   Physical Exam  Constitutional: She is oriented to person, place, and time. She appears well-developed and well-nourished. No distress.  HENT:  Head: Normocephalic and atraumatic.  Mouth/Throat: Oropharynx is clear and moist. No oropharyngeal exudate.  Eyes: Conjunctivae and EOM are normal. Pupils are equal, round, and reactive to light.  Neck: Normal range of motion. Neck supple.  Cardiovascular: Normal rate and regular rhythm.   Pulmonary/Chest: Effort normal and breath sounds normal. No respiratory distress. She has no wheezes.  Abdominal: Soft. Bowel sounds are normal. There is no tenderness.  Musculoskeletal: Normal range of motion.  Neurological: She is alert and oriented to person, place, and time. No cranial nerve deficit. She exhibits normal muscle tone. Coordination normal.  Skin: Skin is warm.  Nursing note and vitals reviewed.    ED Treatments / Results  Labs (all labs ordered are listed, but only abnormal results are displayed) Labs Reviewed - No data to display  EKG  EKG Interpretation  Date/Time:  Sunday December 27 2015 12:29:15 EDT Ventricular Rate:  82 PR Interval:  112 QRS Duration: 92 QT Interval:  376 QTC Calculation: 439 R Axis:   -13 Text Interpretation:  Sinus rhythm with marked sinus arrhythmia Minimal voltage criteria for LVH, may be normal variant T wave abnormality, consider anterior ischemia Abnormal ECG No previous ECGs available Confirmed by Lorann Tani  MD, Rumaysa Sabatino [phone]([id number]) on 12/27/2015 2:15:04 PM       Radiology Dg Chest 2 View  Result Date: 12/27/2015 CLINICAL DATA:  Patient with cough and shortness of breath. EXAM: CHEST  2  VIEW COMPARISON:  Chest radiograph 12/22/2015. FINDINGS: The heart size and mediastinal contours are within normal limits. Both lungs are clear. The visualized skeletal structures are unremarkable. IMPRESSION: No active cardiopulmonary disease. Electronically Signed   By: Annia Beltrew  Davis M.D.   On: 12/27/2015 14:28    Procedures Procedures (including critical care time)  Medications Ordered in ED Medications - No data to display   Initial Impression / Assessment and Plan / ED Course  I have reviewed the triage vital signs and the nursing notes.  Pertinent labs & imaging results that were available during my care of the patient were reviewed by me and considered in my medical decision making (see chart for details).  Clinical Course   Patient describes sort of a chronic bronchitis. States not due to allergies which she's had it since childhood. His had several turbinate surgeries done by ear nose and throat. Most recently in  2015 by Dr. Suszanne Conners.   Chest x-ray negative again today.  We'll give patient a trial of a long-acting antihistamine and if that doesn't work she can switch over to prednisone. Both referred to delay on the prednisone since she is diabetic. Also gave her referral back to ear nose and throat. Patient nontoxic no acute distress.  Patient will continue the albuterol inhalers. Final Clinical Impressions(s) / ED Diagnoses   Final diagnoses:  Bronchitis    New Prescriptions New Prescriptions   CETIRIZINE (ZYRTEC) 10 MG TABLET    Take 1 tablet (10 mg total) by mouth daily.   PREDNISONE (DELTASONE) 10 MG TABLET    Take 4 tablets (40 mg total) by mouth daily.     Vanetta Mulders, MD 12/27/15 1529

## 2015-12-27 NOTE — ED Triage Notes (Signed)
Patient c/o shortness of breath with cough. Per patient only occasionally productive. Patient states seen here in ER a week and a half ago for same reason and had a chest x-ray. Patient states "They didn't see anything but told me to return if it got worse." Patient unsure of any fevers.

## 2015-12-27 NOTE — Discharge Instructions (Signed)
Continue to use albuterol inhaler every 6 hours. Trial of the Zyrtec for 1 week. If it does not help then try the prednisone as directed. But this will bump her blood sugar. Make an appointment to follow up with ear nose and throat.

## 2016-01-04 ENCOUNTER — Ambulatory Visit (INDEPENDENT_AMBULATORY_CARE_PROVIDER_SITE_OTHER): Payer: Medicaid Other | Admitting: Otolaryngology

## 2016-01-04 DIAGNOSIS — R05 Cough: Secondary | ICD-10-CM

## 2016-01-05 ENCOUNTER — Emergency Department (HOSPITAL_COMMUNITY)
Admission: EM | Admit: 2016-01-05 | Discharge: 2016-01-05 | Disposition: A | Discharge status: Home / Self Care | Payer: Medicaid Other | Attending: Emergency Medicine | Admitting: Emergency Medicine

## 2016-01-05 ENCOUNTER — Encounter (HOSPITAL_COMMUNITY): Payer: Self-pay | Admitting: Cardiology

## 2016-01-05 ENCOUNTER — Emergency Department (HOSPITAL_COMMUNITY): Payer: Medicaid Other

## 2016-01-05 DIAGNOSIS — N390 Urinary tract infection, site not specified: Secondary | ICD-10-CM | POA: Diagnosis not present

## 2016-01-05 DIAGNOSIS — R739 Hyperglycemia, unspecified: Secondary | ICD-10-CM

## 2016-01-05 DIAGNOSIS — R05 Cough: Secondary | ICD-10-CM | POA: Diagnosis not present

## 2016-01-05 DIAGNOSIS — F17210 Nicotine dependence, cigarettes, uncomplicated: Secondary | ICD-10-CM | POA: Insufficient documentation

## 2016-01-05 DIAGNOSIS — E1165 Type 2 diabetes mellitus with hyperglycemia: Secondary | ICD-10-CM | POA: Insufficient documentation

## 2016-01-05 DIAGNOSIS — Z79899 Other long term (current) drug therapy: Secondary | ICD-10-CM | POA: Diagnosis not present

## 2016-01-05 DIAGNOSIS — Z7984 Long term (current) use of oral hypoglycemic drugs: Secondary | ICD-10-CM | POA: Insufficient documentation

## 2016-01-05 DIAGNOSIS — Z794 Long term (current) use of insulin: Secondary | ICD-10-CM | POA: Insufficient documentation

## 2016-01-05 LAB — CBC WITH DIFFERENTIAL/PLATELET
Basophils Absolute: 0 10*3/uL (ref 0.0–0.1)
Basophils Relative: 0 %
Eosinophils Absolute: 0 10*3/uL (ref 0.0–0.7)
Eosinophils Relative: 1 %
HCT: 38.3 % (ref 36.0–46.0)
HEMOGLOBIN: 13 g/dL (ref 12.0–15.0)
LYMPHS ABS: 1.6 10*3/uL (ref 0.7–4.0)
LYMPHS PCT: 32 %
MCH: 29.7 pg (ref 26.0–34.0)
MCHC: 33.9 g/dL (ref 30.0–36.0)
MCV: 87.6 fL (ref 78.0–100.0)
Monocytes Absolute: 0.3 10*3/uL (ref 0.1–1.0)
Monocytes Relative: 6 %
NEUTROS ABS: 3 10*3/uL (ref 1.7–7.7)
NEUTROS PCT: 61 %
Platelets: 166 10*3/uL (ref 150–400)
RBC: 4.37 MIL/uL (ref 3.87–5.11)
RDW: 13 % (ref 11.5–15.5)
WBC: 5 10*3/uL (ref 4.0–10.5)

## 2016-01-05 LAB — URINE MICROSCOPIC-ADD ON

## 2016-01-05 LAB — BASIC METABOLIC PANEL
ANION GAP: 9 (ref 5–15)
BUN: 6 mg/dL (ref 6–20)
CHLORIDE: 101 mmol/L (ref 101–111)
CO2: 22 mmol/L (ref 22–32)
Calcium: 8 mg/dL — ABNORMAL LOW (ref 8.9–10.3)
Creatinine, Ser: 0.52 mg/dL (ref 0.44–1.00)
GFR calc Af Amer: >60 mL/min (ref >60)
GFR calc non Af Amer: >60 mL/min (ref >60)
GLUCOSE: 344 mg/dL — AB (ref 65–99)
POTASSIUM: 3.4 mmol/L — AB (ref 3.5–5.1)
Sodium: 132 mmol/L — ABNORMAL LOW (ref 135–145)

## 2016-01-05 LAB — BLOOD GAS, VENOUS
ACID-BASE DEFICIT: 3.2 mmol/L — AB (ref 0.0–2.0)
BICARBONATE: 21.7 mmol/L (ref 20.0–28.0)
O2 Saturation: 98.7 %
PH VEN: 7.356 (ref 7.250–7.430)
PO2 VEN: 141 mmHg — AB (ref 32.0–45.0)
pCO2, Ven: 39.2 mmHg — ABNORMAL LOW (ref 44.0–60.0)

## 2016-01-05 LAB — CBG MONITORING, ED
GLUCOSE-CAPILLARY: 253 mg/dL — AB (ref 65–99)
Glucose-Capillary: 395 mg/dL — ABNORMAL HIGH (ref 65–99)
Glucose-Capillary: 294 mg/dL — ABNORMAL HIGH (ref 65–99)

## 2016-01-05 LAB — URINALYSIS, ROUTINE W REFLEX MICROSCOPIC
Bilirubin Urine: NEGATIVE
KETONES UR: 40 mg/dL — AB
LEUKOCYTES UA: NEGATIVE
Nitrite: NEGATIVE
PH: 5.5 (ref 5.0–8.0)
Protein, ur: NEGATIVE mg/dL
SPECIFIC GRAVITY, URINE: 1.015 (ref 1.005–1.030)

## 2016-01-05 LAB — I-STAT BETA HCG BLOOD, ED (MC, WL, AP ONLY): I-stat hCG, quantitative: <5 m[IU]/mL (ref <5)

## 2016-01-05 MED ORDER — LACTATED RINGERS IV BOLUS (SEPSIS)
1000.0000 mL | Freq: Once | INTRAVENOUS | Status: AC
  Administered 2016-01-05: 1000 mL via INTRAVENOUS

## 2016-01-05 MED ORDER — PROMETHAZINE HCL 25 MG PO TABS: 25.0000 mg | ORAL_TABLET | Freq: Three times a day (TID) | ORAL | 0 refills | Status: DC | PRN

## 2016-01-05 MED ORDER — CEPHALEXIN 500 MG PO CAPS: 500.0000 mg | ORAL_CAPSULE | Freq: Three times a day (TID) | ORAL | 0 refills | Status: AC

## 2016-01-05 MED ORDER — POTASSIUM CHLORIDE CRYS ER 20 MEQ PO TBCR
40.0000 meq | EXTENDED_RELEASE_TABLET | Freq: Once | ORAL | Status: AC
  Administered 2016-01-05: 40 meq via ORAL
  Filled 2016-01-05: qty 2

## 2016-01-05 MED ORDER — ONDANSETRON HCL 4 MG/2ML IJ SOLN
4.0000 mg | Freq: Once | INTRAMUSCULAR | Status: AC
  Administered 2016-01-05: 4 mg via INTRAVENOUS

## 2016-01-05 MED ORDER — DEXTROSE 5 % IV SOLN
1.0000 g | Freq: Once | INTRAVENOUS | Status: AC
  Administered 2016-01-05: 1 g via INTRAVENOUS
  Filled 2016-01-05: qty 10

## 2016-01-05 MED ORDER — PROMETHAZINE HCL 12.5 MG PO TABS
25.0000 mg | ORAL_TABLET | Freq: Once | ORAL | Status: AC
  Administered 2016-01-05: 25 mg via ORAL
  Filled 2016-01-05: qty 2

## 2016-01-05 MED ORDER — SODIUM CHLORIDE 0.9 % IV BOLUS (SEPSIS)
1000.0000 mL | Freq: Once | INTRAVENOUS | Status: AC
  Administered 2016-01-05: 1000 mL via INTRAVENOUS

## 2016-01-05 MED ORDER — INSULIN ASPART 100 UNIT/ML ~~LOC~~ SOLN
8.0000 [IU] | Freq: Once | SUBCUTANEOUS | Status: AC
  Administered 2016-01-05: 8 [IU] via SUBCUTANEOUS
  Filled 2016-01-05: qty 1

## 2016-01-05 MED ORDER — ONDANSETRON HCL 4 MG/2ML IJ SOLN
INTRAMUSCULAR | Status: AC
  Filled 2016-01-05: qty 2

## 2016-01-05 NOTE — ED Provider Notes (Signed)
[facility]-EM[facility]Y DEPT Provider Note   CSN: [id number] Arrival date & time: 01/05/16  1447     History   Chief Complaint Chief Complaint  Patient presents with  . Hyperglycemia    HPI Shelley Baldwin is a 20 y.o. female.  HPI 20 yo F with PMHx of morbid obesity, DM2 (diagnosed 3 months ago) who presents with hyperglycemia. Pt states that over the past week, she has had persistently worsening blood sugars in the 250-400 range at home. She has been trying to take her medications but admits to intermittently missing doses. Over past 24 hrs, she has had polyuria, polydispsia, SOB, and lightheadedness. She was walking earlier today and felt lightheaded, as though she was going to pass out. She subsequently presented to the ED. Denies any actual syncope. Only associated sx include mild, non-productive cough. No abdominal pain, nausea, vomiting, diarrhea. Denies known h/o DKA.  Past Medical History:  Diagnosis Date  . Diabetes mellitus without complication (HCC)   . Nasal turbinate hypertrophy 07/2013    Patient Active Problem List   Diagnosis Date Noted  . Type 2 diabetes mellitus without complication, without long-term current use of insulin (HCC) 10/12/2015  . Elevated blood pressure 10/12/2015  . Morbid obesity (HCC) 10/12/2015  . IUD (intrauterine device) in place 10/12/2015  . Tobacco abuse 10/12/2015    Past Surgical History:  Procedure Laterality Date  . ADENOIDECTOMY    . TURBINATE REDUCTION     x 3, per mother  . TURBINATE REDUCTION Bilateral 08/05/2013   Procedure: BILATERAL TURBINATE REDUCTION;  Surgeon: Darletta Moll, MD;  Location: [facility];  Service: ENT;  Laterality: Bilateral;    OB History    No data available       Home Medications    Prior to Admission medications   Medication Sig Start Date End Date Taking? Authorizing Provider  cetirizine (ZYRTEC) 10 MG tablet Take 1 tablet (10 mg total) by mouth daily. 12/27/15  Yes Vanetta Mulders, MD    guaiFENesin-codeine 100-10 MG/5ML syrup Take 5 mLs by mouth every 4 (four) hours as needed for cough.   Yes Historical Provider, MD  Insulin Glargine (LANTUS SOLOSTAR) 100 UNIT/ML Solostar Pen Inject 10 Units into the skin daily at 10 pm. 11/05/15  Yes Johna Sheriff, MD  JANUVIA 100 MG tablet TAKE 1 TABLET (100 MG TOTAL) BY MOUTH DAILY. Patient taking differently: Take 50 mg by mouth daily.  11/06/15  Yes Johna Sheriff, MD  metFORMIN (GLUCOPHAGE-XR) 750 MG 24 hr tablet Take 750 mg by mouth 2 (two) times daily.   Yes Historical Provider, MD  predniSONE (DELTASONE) 10 MG tablet Take 4 tablets (40 mg total) by mouth daily. 12/27/15  Yes Vanetta Mulders, MD  azithromycin (ZITHROMAX) 250 MG tablet Take 1 tablet (250 mg total) by mouth daily. Take first 2 tablets together, then 1 every day until finished. Patient not taking: Reported on 01/05/2016 12/22/15   Elson Areas, PA-C  cephALEXin (KEFLEX) 500 MG capsule Take 1 capsule (500 mg total) by mouth 3 (three) times daily. 01/05/16 01/12/16  Shaune Pollack, MD  glucose blood (ACCU-CHEK GUIDE) test strip Use as instructed 09/19/15   Mechele Claude, MD  glucose blood test strip Check blood sugar one time daily and as needed 09/17/15   Mechele Claude, MD  Insulin Pen Needle (EXEL COMFORT POINT PEN NEEDLE) 31G X 4 MM MISC 1 application by Does not apply route daily. 11/02/15   Johna Sheriff, MD  Lancets (ACCU-CHEK  MULTICLIX) lancets Use as instructed 09/19/15   Mechele ClaudeWarren Stacks, MD  ondansetron (ZOFRAN-ODT) 4 MG disintegrating tablet Take 4 mg by mouth every 8 (eight) hours as needed for nausea or vomiting.    Historical Provider, MD  promethazine (PHENERGAN) 25 MG tablet Take 1 tablet (25 mg total) by mouth every 8 (eight) hours as needed for nausea or vomiting. 01/05/16   Shaune Pollackameron Naomie Crow, MD    Family History Family History  Problem Relation Age of Onset  . Diabetes Mother     Social History Social History  Substance Use Topics  . Smoking status: Current  Every Day Smoker    Years: 2.00    Types: Cigarettes  . Smokeless tobacco: Never Used     Comment: 5-6 cig./day  . Alcohol use Yes     Comment: occasionally     Allergies   Review of patient's allergies indicates no known allergies.   Review of Systems Review of Systems  Constitutional: Positive for fatigue. Negative for chills and fever.  HENT: Negative for congestion, rhinorrhea and sore throat.   Eyes: Negative for visual disturbance.  Respiratory: Negative for cough, shortness of breath and wheezing.   Cardiovascular: Negative for chest pain and leg swelling.  Gastrointestinal: Negative for abdominal pain, diarrhea, nausea and vomiting.  Endocrine: Positive for polydipsia, polyphagia and polyuria.  Genitourinary: Negative for dysuria, flank pain, vaginal bleeding and vaginal discharge.  Musculoskeletal: Negative for neck pain.  Skin: Negative for rash.  Allergic/Immunologic: Negative for immunocompromised state.  Neurological: Positive for light-headedness. Negative for syncope and headaches.  Hematological: Does not bruise/bleed easily.  All other systems reviewed and are negative.    Physical Exam Updated Vital Signs BP 109/76   Pulse 84   Temp 97.7 F (36.5 C) (Oral)   Resp 14   Ht 5\' 7"  (1.702 m)   Wt 270 lb (122.5 kg)   SpO2 100%   BMI 42.29 kg/m   Physical Exam  Constitutional: She is oriented to person, place, and time. She appears well-developed and well-nourished. No distress.  HENT:  Head: Normocephalic and atraumatic.  Mildly dry mucous membranes  Eyes: Conjunctivae are normal.  Neck: Neck supple.  Cardiovascular: Normal rate, regular rhythm and normal heart sounds.  Exam reveals no friction rub.   No murmur heard. Pulmonary/Chest: Effort normal and breath sounds normal. No respiratory distress. She has no wheezes. She has no rales.  Abdominal: She exhibits no distension.  Musculoskeletal: She exhibits no edema.  Neurological: She is alert and  oriented to person, place, and time. She exhibits normal muscle tone.  Skin: Skin is warm. Capillary refill takes less than 2 seconds.  Psychiatric: She has a normal mood and affect.  Nursing note and vitals reviewed.    ED Treatments / Results  Labs (all labs ordered are listed, but only abnormal results are displayed) Labs Reviewed  BASIC METABOLIC PANEL - Abnormal; Notable for the following:       Result Value   Sodium 132 (*)    Potassium 3.4 (*)    Glucose, Bld 344 (*)    Calcium 8.0 (*)    All other components within normal limits  BLOOD GAS, VENOUS - Abnormal; Notable for the following:    pCO2, Ven 39.2 (*)    pO2, Ven 141.0 (*)    Acid-base deficit 3.2 (*)    All other components within normal limits  URINALYSIS, ROUTINE W REFLEX MICROSCOPIC (NOT AT [facility]) - Abnormal; Notable for the following:    APPearance  HAZY (*)    Glucose, UA >1000 (*)    Hgb urine dipstick TRACE (*)    Ketones, ur 40 (*)    All other components within normal limits  URINE MICROSCOPIC-ADD ON - Abnormal; Notable for the following:    Squamous Epithelial / LPF TOO NUMEROUS TO COUNT (*)    Bacteria, UA FEW (*)    All other components within normal limits  CBG MONITORING, ED - Abnormal; Notable for the following:    Glucose-Capillary 395 (*)    All other components within normal limits  CBG MONITORING, ED - Abnormal; Notable for the following:    Glucose-Capillary 294 (*)    All other components within normal limits  CBG MONITORING, ED - Abnormal; Notable for the following:    Glucose-Capillary 253 (*)    All other components within normal limits  URINE CULTURE  CBC WITH DIFFERENTIAL/PLATELET  I-STAT BETA HCG BLOOD, ED ([facility], [facility], [facility] ONLY)    EKG  EKG Interpretation  Date/Time:  Tuesday January 05 2016 15:33:12 EDT Ventricular Rate:  82 PR Interval:    QRS Duration: 102 QT Interval:  394 QTC Calculation: 461 R Axis:   25 Text Interpretation:  Sinus rhythm Borderline short PR interval  No significant change since last tracing Confirmed by Lunabelle Oatley MD, Sheria Lang [phone]) on 01/05/2016 4:05:47 PM       Radiology Dg Chest 2 View  Result Date: 01/05/2016 CLINICAL DATA:  Cough, shortness of breath for 3 weeks EXAM: CHEST  2 VIEW COMPARISON:  12/27/2015 FINDINGS: The heart size and mediastinal contours are within normal limits. Both lungs are clear. The visualized skeletal structures are unremarkable. IMPRESSION: No active cardiopulmonary disease. Electronically Signed   By: Natasha Mead M.D.   On: 01/05/2016 16:02    Procedures Procedures (including critical care time)  Medications Ordered in ED Medications  sodium chloride 0.9 % bolus 1,000 mL (0 mLs Intravenous Stopped 01/05/16 1543)  ondansetron (ZOFRAN) injection 4 mg (4 mg Intravenous Given 01/05/16 1539)  lactated ringers bolus 1,000 mL (0 mLs Intravenous Stopped 01/05/16 1712)  insulin aspart (novoLOG) injection 8 Units (8 Units Subcutaneous Given 01/05/16 1621)  cefTRIAXone (ROCEPHIN) 1 g in dextrose 5 % 50 mL IVPB (0 g Intravenous Stopped 01/05/16 1712)  promethazine (PHENERGAN) tablet 25 mg (25 mg Oral Given 01/05/16 1622)  potassium chloride SA (K-DUR,KLOR-CON) CR tablet 40 mEq (40 mEq Oral Given 01/05/16 1657)     Initial Impression / Assessment and Plan / ED Course  I have reviewed the triage vital signs and the nursing notes.  Pertinent labs & imaging results that were available during my care of the patient were reviewed by me and considered in my medical decision making (see chart for details).  Clinical Course  Value Comment By Time  MCHC: 33.9 (Reviewed) Shaune Pollack, MD 09/27 1029    20 yo F with PMHx of T2DM who p/w hyperglycemia, general fatigue. BS>300 at home for several days - pt admits she may have missed a dose or two of her medications. On arrival here, VSS and WNL. Pt mildly dehydrated on exam but o/w non-toxic. Suspect hyperglycemia 2/2 non-adherence, dietary indiscretion. Will check labs, eval  infection/metabolic abnormalities, and start IVF.  Sx markedly improved with IVF. CBC without leukocytosis or anemia. UA has small ketones c/w dehydration but BMP shows CO22, AG 9, and pH 7.36 - do not suspect DKA. UA does show +WBCs c/w possible UTI. Will continue fluids, SQ insulin, and re-assess.  BG now 253. Pt  markedly improved per her report. Will d/c with outpt management for UTI, advise PCP f/u.  Final Clinical Impressions(s) / ED Diagnoses   Final diagnoses:  Hyperglycemia  UTI (lower urinary tract infection)    New Prescriptions Discharge Medication List as of 01/05/2016  5:29 PM    START taking these medications   Details  cephALEXin (KEFLEX) 500 MG capsule Take 1 capsule (500 mg total) by mouth 3 (three) times daily., Starting Tue 01/05/2016, Until Tue 01/12/2016, Print    promethazine (PHENERGAN) 25 MG tablet Take 1 tablet (25 mg total) by mouth every 8 (eight) hours as needed for nausea or vomiting., Starting Tue 01/05/2016, Print         Shaune Pollack, MD 01/06/16 1030

## 2016-01-05 NOTE — ED Triage Notes (Signed)
EMS arrived ,  pt hard to arouse and confused.  CBG 480 initially.  500 cc normal saline bolus.  Pt took insulin prior to EMS arrival.  Recheck CBG 351.  EMS gave 2nd 500 cc bolus.

## 2016-01-07 LAB — URINE CULTURE

## 2016-01-12 ENCOUNTER — Encounter: Payer: Self-pay | Admitting: Nurse Practitioner

## 2016-01-12 ENCOUNTER — Ambulatory Visit (INDEPENDENT_AMBULATORY_CARE_PROVIDER_SITE_OTHER): Payer: Medicaid Other | Admitting: Nurse Practitioner

## 2016-01-12 VITALS — BP 138/89 | HR 89 | Temp 97.7°F | Ht 67.0 in | Wt 259.0 lb

## 2016-01-12 DIAGNOSIS — B372 Candidiasis of skin and nail: Secondary | ICD-10-CM | POA: Diagnosis not present

## 2016-01-12 MED ORDER — FLUCONAZOLE 150 MG PO TABS: ORAL_TABLET | ORAL | 0 refills | Status: DC

## 2016-01-12 MED ORDER — NYSTATIN 100000 UNIT/GM EX CREA: 1.0000 "application " | TOPICAL_CREAM | Freq: Two times a day (BID) | CUTANEOUS | 0 refills | Status: DC

## 2016-01-12 NOTE — Patient Instructions (Signed)
Cutaneous Candidiasis °Cutaneous candidiasis is a condition in which there is an overgrowth of yeast (candida) on the skin. Yeast normally live on the skin, but in small enough numbers not to cause any symptoms. In certain cases, increased growth of the yeast may cause an actual yeast infection. This kind of infection usually occurs in areas of the skin that are constantly warm and moist, such as the armpits or the groin. Yeast is the most common cause of diaper rash in babies and in people who cannot control their bowel movements (incontinence). °CAUSES  °The fungus that most often causes cutaneous candidiasis is Candida albicans. Conditions that can increase the risk of getting a yeast infection of the skin include: °· Obesity. °· Pregnancy. °· Diabetes. °· Taking antibiotic medicine. °· Taking birth control pills. °· Taking steroid medicines. °· Thyroid disease. °· An iron or zinc deficiency. °· Problems with the immune system. °SYMPTOMS  °· Red, swollen area of the skin. °· Bumps on the skin. °· Itchiness. °DIAGNOSIS  °The diagnosis of cutaneous candidiasis is usually based on its appearance. Light scrapings of the skin may also be taken and viewed under a microscope to identify the presence of yeast. °TREATMENT  °Antifungal creams may be applied to the infected skin. In severe cases, oral medicines may be needed.  °HOME CARE INSTRUCTIONS  °· Keep your skin clean and dry. °· Maintain a healthy weight. °· If you have diabetes, keep your blood sugar under control. °SEEK IMMEDIATE MEDICAL CARE IF: °· Your rash continues to spread despite treatment. °· You have a fever, chills, or abdominal pain. °  °This information is not intended to replace advice given to you by your health care provider. Make sure you discuss any questions you have with your health care provider. °  °Document Released: 12/14/2010 Document Revised: 06/20/2011 Document Reviewed: 09/29/2014 °Elsevier Interactive Patient Education ©2016 Elsevier  Inc. ° °

## 2016-01-12 NOTE — Progress Notes (Signed)
   Subjective:    Patient ID: Shelley Baldwin, female    DOB: Aug 24, 1995, 20 y.o.   MRN: [id number]  HPI Patient here today for rash-like skin lesion that started about three weeks ago, she does not know what precipitated it. The lesion is located under her breast bilaterally.She described the rash as itchy, painful -burning.  She said the rash is spreading across her chest. She denies chest pain or shortness of breath.  Review of Systems  Constitutional: Negative.   Respiratory: Negative.   Cardiovascular: Negative.   Skin: Positive for rash.  Allergic/Immunologic: Negative.        Objective:   Physical Exam  Constitutional: She appears well-developed and well-nourished.  Skin: Skin is warm and dry. There is erythema (eerythematous moist rash with fissures and white cheesy excudate.).  Psychiatric: She has a normal mood and affect.   BP 138/89   Pulse 89   Temp 97.7 F (36.5 C) (Oral)   Ht 5\' 7"  (1.702 m)   Wt 259 lb (117.5 kg)   BMI 40.57 kg/m       Assessment & Plan:  1. Cutaneous candidiasis Wear loose fitting clothing Change under garment regularly Use gentle and scent free soap on affected area - nystatin cream (MYCOSTATIN); Apply 1 application topically 2 (two) times daily.  Dispense: 30 g; Refill: 0 - fluconazole (DIFLUCAN) 150 MG tablet; 1 po q week x 4 weeks  Dispense: 4 tablet; Refill: 0  Take meds as prescribed Follow up  in PRN if symptoms worsens or does not get better   Reuel BoomQueeneth Mbemena, RN, NP Student   Shelley Daphine DeutscherMartin, FNP

## 2016-01-15 ENCOUNTER — Ambulatory Visit (INDEPENDENT_AMBULATORY_CARE_PROVIDER_SITE_OTHER): Payer: Medicaid Other | Admitting: Family Medicine

## 2016-01-15 ENCOUNTER — Encounter: Payer: Self-pay | Admitting: *Deleted

## 2016-01-15 ENCOUNTER — Encounter: Payer: Self-pay | Admitting: Family Medicine

## 2016-01-15 VITALS — BP 135/85 | HR 69 | Temp 97.9°F | Ht 67.0 in | Wt 260.1 lb

## 2016-01-15 DIAGNOSIS — E11620 Type 2 diabetes mellitus with diabetic dermatitis: Secondary | ICD-10-CM | POA: Diagnosis not present

## 2016-01-15 DIAGNOSIS — B372 Candidiasis of skin and nail: Secondary | ICD-10-CM | POA: Diagnosis not present

## 2016-01-15 DIAGNOSIS — Z794 Long term (current) use of insulin: Secondary | ICD-10-CM | POA: Diagnosis not present

## 2016-01-15 MED ORDER — GLUCOSE BLOOD VI STRP: ORAL_STRIP | 2 refills | Status: DC

## 2016-01-15 MED ORDER — FLUCONAZOLE 150 MG PO TABS: 150.0000 mg | ORAL_TABLET | Freq: Every day | ORAL | 0 refills | Status: DC

## 2016-01-15 MED ORDER — INSULIN GLARGINE 100 UNIT/ML SOLOSTAR PEN: 26.0000 [IU] | PEN_INJECTOR | Freq: Every day | SUBCUTANEOUS | 2 refills | Status: DC

## 2016-01-15 MED ORDER — SITAGLIPTIN PHOS-METFORMIN HCL 50-1000 MG PO TABS: 1.0000 | ORAL_TABLET | Freq: Two times a day (BID) | ORAL | 0 refills | Status: AC

## 2016-01-15 MED ORDER — TRAMADOL HCL 50 MG PO TABS: 50.0000 mg | ORAL_TABLET | Freq: Three times a day (TID) | ORAL | 0 refills | Status: DC | PRN

## 2016-01-15 NOTE — Progress Notes (Signed)
BP 135/85   Pulse 69   Temp 97.9 F (36.6 C) (Oral)   Ht 5\' 7"  (1.702 m)   Wt 260 lb 2 oz (118 kg)   BMI 40.74 kg/m    Subjective:    Patient ID: Shelley Baldwin, female    DOB: 1995/12/16, 20 y.o.   MRN: [id number]  HPI: Shelley RunningKelly E Stansell is a 20 y.o. female presenting on 01/15/2016 for Open Wound (pt here today c/o skin irritation under the axillary region and she says it burns and stings and keeps opening up)   HPI Rash that is worsening Patient comes in with yeast dermatitis rash that is under her breasts under her axilla and in her intertrigo areas of the groin that is worsening. She did take her first dose of Diflucan 2 days ago and has been using topical agents previous to that. She denies any fevers or chills or drainage from any site. The sites are very pink and irritated and pruritic. She is a diabetic and her diabetes has been out of control and that is part of the issues that she is fighting.  Type 2 diabetes uncontrolled Patient's diabetes has been uncontrolled and she has been out of her Januvia and has been using metformin 750 because she ran out of the thousand milligram pills. She says her blood sugars have been between 200s and 400s but she ran out of testing strips a couple weeks ago and does not know where it is running currently. Patient denies headaches, blurred vision, chest pains, shortness of breath, or weakness. Denies any side effects from medication and is content with current medication. She is currently using Lantus 10 units 2 times a day.  Relevant past medical, surgical, family and social history reviewed and updated as indicated. Interim medical history since our last visit reviewed. Allergies and medications reviewed and updated.  Review of Systems  Constitutional: Negative for chills and fever.  Eyes: Negative for redness and visual disturbance.  Respiratory: Negative for chest tightness and shortness of breath.   Cardiovascular: Negative for chest pain  and leg swelling.  Genitourinary: Negative for difficulty urinating, dysuria, vaginal bleeding, vaginal discharge and vaginal pain.  Musculoskeletal: Negative for back pain and gait problem.  Skin: Positive for rash.  Neurological: Negative for dizziness, light-headedness and headaches.  Psychiatric/Behavioral: Negative for agitation and behavioral problems.  All other systems reviewed and are negative.   Per HPI unless specifically indicated above     Medication List       Accurate as of 01/15/16 11:52 AM. Always use your most recent med list.          accu-chek multiclix lancets Use as instructed   cetirizine 10 MG tablet Commonly known as:  ZYRTEC Take 1 tablet (10 mg total) by mouth daily.   fluconazole 150 MG tablet Commonly known as:  DIFLUCAN Take 1 tablet (150 mg total) by mouth daily. Take 1 daily for 4 days and then once a week after that   glucose blood test strip Commonly known as:  ACCU-CHEK GUIDE Use as instructed   Insulin Glargine 100 UNIT/ML Solostar Pen Commonly known as:  LANTUS SOLOSTAR Inject 26 Units into the skin daily at 10 pm. Patient likes to split a half in the morning and half in the evening   Insulin Pen Needle 31G X 4 MM Misc Commonly known as:  EXEL COMFORT POINT PEN NEEDLE 1 application by Does not apply route daily.   nystatin cream Commonly known as:  MYCOSTATIN Apply 1 application topically 2 (two) times daily.   promethazine 25 MG tablet Commonly known as:  PHENERGAN Take 1 tablet (25 mg total) by mouth every 8 (eight) hours as needed for nausea or vomiting.   sitaGLIPtin-metformin 50-1000 MG tablet Commonly known as:  JANUMET Take 1 tablet by mouth 2 (two) times daily with a meal.   traMADol 50 MG tablet Commonly known as:  ULTRAM Take 1 tablet (50 mg total) by mouth every 8 (eight) hours as needed.          Objective:    BP 135/85   Pulse 69   Temp 97.9 F (36.6 C) (Oral)   Ht 5\' 7"  (1.702 m)   Wt 260 lb 2 oz  (118 kg)   BMI 40.74 kg/m   Wt Readings from Last 3 Encounters:  01/15/16 260 lb 2 oz (118 kg)  01/12/16 259 lb (117.5 kg)  01/05/16 270 lb (122.5 kg)    Physical Exam  Constitutional: She is oriented to person, place, and time. She appears well-developed and well-nourished. No distress.  Eyes: Conjunctivae are normal.  Cardiovascular: Normal rate, regular rhythm, normal heart sounds and intact distal pulses.   No murmur heard. Pulmonary/Chest: Effort normal and breath sounds normal. No respiratory distress. She has no wheezes.  Musculoskeletal: Normal range of motion. She exhibits no edema or tenderness.  Neurological: She is alert and oriented to person, place, and time. Coordination normal.  Skin: Skin is warm and dry. Rash (The rash is under the patient's axilla and if a few small patches under both of her breasts and cleavage region and in all of the intertrigo areas around her groin and buttocks.) noted. Rash is maculopapular (Maculopapular rash with confluence and satellite lesions that is pink red and elevated and very tender to palpation. Consistent with candidal infection). She is not diaphoretic.  Psychiatric: She has a normal mood and affect. Her behavior is normal.  Nursing note and vitals reviewed.     Assessment & Plan:   Problem List Items Addressed This Visit      Endocrine   Type 2 diabetes mellitus (HCC)   Relevant Medications   sitaGLIPtin-metformin (JANUMET) 50-1000 MG tablet   Insulin Glargine (LANTUS SOLOSTAR) 100 UNIT/ML Solostar Pen   glucose blood (ACCU-CHEK GUIDE) test strip    Other Visit Diagnoses    Cutaneous candidiasis    -  Primary   Relevant Medications   fluconazole (DIFLUCAN) 150 MG tablet   traMADol (ULTRAM) 50 MG tablet       Follow up plan: Return in about 1 week (around 01/22/2016), or if symptoms worsen or fail to improve, for Recheck dermatitis and diabetes.  Counseling provided for all of the vaccine components No orders of the  defined types were placed in this encounter.   Arville Care, MD [facility] 01/15/2016, 11:52 AM

## 2016-01-18 ENCOUNTER — Telehealth: Payer: Self-pay | Admitting: Pediatrics

## 2016-01-18 DIAGNOSIS — Z794 Long term (current) use of insulin: Principal | ICD-10-CM

## 2016-01-18 DIAGNOSIS — E11620 Type 2 diabetes mellitus with diabetic dermatitis: Secondary | ICD-10-CM

## 2016-01-18 MED ORDER — GLUCOSE BLOOD VI STRP: ORAL_STRIP | 2 refills | Status: DC

## 2016-01-18 NOTE — Telephone Encounter (Signed)
Patient aware that our office is currently out of samples of test strip.   New Rx sent to pharmacy

## 2016-01-22 ENCOUNTER — Ambulatory Visit: Payer: Medicaid Other | Admitting: Pediatrics

## 2016-01-25 ENCOUNTER — Ambulatory Visit (INDEPENDENT_AMBULATORY_CARE_PROVIDER_SITE_OTHER): Payer: Medicaid Other | Admitting: Pediatrics

## 2016-01-25 ENCOUNTER — Encounter: Payer: Self-pay | Admitting: Pediatrics

## 2016-01-25 ENCOUNTER — Telehealth: Payer: Self-pay | Admitting: *Deleted

## 2016-01-25 VITALS — BP 131/89 | HR 79 | Temp 98.6°F | Ht 67.0 in | Wt 256.0 lb

## 2016-01-25 DIAGNOSIS — B379 Candidiasis, unspecified: Secondary | ICD-10-CM

## 2016-01-25 DIAGNOSIS — A599 Trichomoniasis, unspecified: Secondary | ICD-10-CM | POA: Diagnosis not present

## 2016-01-25 DIAGNOSIS — I10 Essential (primary) hypertension: Secondary | ICD-10-CM | POA: Diagnosis not present

## 2016-01-25 DIAGNOSIS — Z794 Long term (current) use of insulin: Secondary | ICD-10-CM | POA: Diagnosis not present

## 2016-01-25 DIAGNOSIS — E11620 Type 2 diabetes mellitus with diabetic dermatitis: Secondary | ICD-10-CM | POA: Diagnosis not present

## 2016-01-25 LAB — URINALYSIS, COMPLETE
BILIRUBIN UA: NEGATIVE
KETONES UA: NEGATIVE
LEUKOCYTES UA: NEGATIVE
Nitrite, UA: NEGATIVE
PROTEIN UA: NEGATIVE
RBC UA: NEGATIVE
SPEC GRAV UA: 1.015 (ref 1.005–1.030)
Urobilinogen, Ur: 0.2 mg/dL (ref 0.2–1.0)
pH, UA: 7 (ref 5.0–7.5)

## 2016-01-25 LAB — WET PREP FOR TRICH, YEAST, CLUE
Clue Cell Exam: NEGATIVE
Trichomonas Exam: POSITIVE — AB
YEAST EXAM: NEGATIVE

## 2016-01-25 LAB — MICROSCOPIC EXAMINATION: BACTERIA UA: NONE SEEN

## 2016-01-25 LAB — BAYER DCA HB A1C WAIVED: HB A1C (BAYER DCA - WAIVED): >14 % — ABNORMAL HIGH (ref <7.0)

## 2016-01-25 MED ORDER — HYDROCHLOROTHIAZIDE 12.5 MG PO TABS: 12.5000 mg | ORAL_TABLET | Freq: Every day | ORAL | 1 refills | Status: DC

## 2016-01-25 MED ORDER — NYSTATIN 100000 UNIT/GM EX POWD: Freq: Two times a day (BID) | CUTANEOUS | 1 refills | Status: DC

## 2016-01-25 MED ORDER — INSULIN GLARGINE 100 UNIT/ML SOLOSTAR PEN: 20.0000 [IU] | PEN_INJECTOR | Freq: Two times a day (BID) | SUBCUTANEOUS | 2 refills | Status: DC

## 2016-01-25 MED ORDER — HYDROCHLOROTHIAZIDE 12.5 MG PO TABS: 25.0000 mg | ORAL_TABLET | Freq: Every day | ORAL | 1 refills | Status: DC

## 2016-01-25 MED ORDER — METRONIDAZOLE 500 MG PO TABS: 500.0000 mg | ORAL_TABLET | Freq: Three times a day (TID) | ORAL | 0 refills | Status: DC

## 2016-01-25 NOTE — Patient Instructions (Signed)
Increase to 20 units lantus morning and night Continue for 4 days If morning blood sugar still over 150, increase by 1 morning and night every 3 days to max of 25 twice a day Come back in 4 weeks  Use nystatin powder as needed  Start hydrochlorothiazide for blood pressure, take once a day in the morning

## 2016-01-25 NOTE — Progress Notes (Signed)
  Subjective:   Patient ID: Shelley Baldwin, female    DOB: 18-Nov-1995, 20 y.o.   MRN: [id number] CC: Follow-up (Yeast infection)  HPI: Shelley Baldwin is a 20 y.o. female presenting for Follow-up (Yeast infection)  Dm2: Lowest BGL in mid-200s lantus 17u BID Also taking janumet in the morning BGLs in the morning high 200s After lunch mid 200s Pt says she is taking medicine regularly, not missing days or doses  Candidal intertrigo Rash: Still there, but much better Under breasts, under arm pits  Elevated BP: Occasional headaches  Thick white discharge for the past few days No itching, no pain  Relevant past medical, surgical, family and social history reviewed. Allergies and medications reviewed and updated. History  Smoking Status  . Current Every Day Smoker  . Years: 2.00  . Types: Cigarettes  Smokeless Tobacco  . Never Used    Comment: 5-6 cig./day   ROS: Per HPI   Objective:    BP 131/89   Pulse 79   Temp 98.6 F (37 C) (Oral)   Ht '5\' 7"'$  (1.702 m)   Wt 256 lb (116.1 kg)   BMI 40.10 kg/m   Wt Readings from Last 3 Encounters:  01/25/16 256 lb (116.1 kg)  01/15/16 260 lb 2 oz (118 kg)  01/12/16 259 lb (117.5 kg)    Gen: NAD, alert, cooperative with exam, NCAT EYES: EOMI, no conjunctival injection, or no icterus ENT:  TMs pearly gray b/l, OP without erythema LYMPH: no cervical LAD CV: NRRR, normal S1/S2, no murmur, distal pulses 2+ b/l Resp: CTABL, no wheezes, normal WOB Ext: No edema, warm Neuro: Alert and oriented, strength equal b/l UE and LE, coordination grossly normal Skin: pink rash, dry under breasts b/l  Assessment & Plan:  Kenlynn was seen today for follow-up multiple med problems.  Diagnoses and all orders for this visit:  Yeast infection Improved, still with rash -     nystatin (MYCOSTATIN/NYSTOP) powder; Apply topically 2 (two) times daily.  Type 2 diabetes mellitus with diabetic dermatitis, with long-term current use of insulin ([place]) Pt  says she is compliant, increase lantus to 20u BID After 3 days, increase by 1 u BID every 3 days to 25 units RTC 4 weeks Cont to avoid carbs -     Insulin Glargine (LANTUS SOLOSTAR) 100 UNIT/ML Solostar Pen; Inject 20 Units into the skin 2 (two) times daily. -     Bayer DCA Hb A1c Waived  Essential hypertension Remains elevated Start HCTZ -     hydrochlorothiazide (HYDRODIURIL) 12.5 MG tablet; Take 1 tablet (12.5 mg total) by mouth daily. -     BMP8+EGFR  Trichomonas infection Trich on wet prep -     metroNIDAZOLE (FLAGYL) 500 MG tablet; Take 1 tablet (500 mg total) by mouth twice daily.  Other orders -     Microscopic Examination   Follow up plan: Return in about 4 weeks (around 02/22/2016) for med follow up. Assunta Found, MD [place]

## 2016-01-25 NOTE — Telephone Encounter (Signed)
Patient aware of lab results and antibiotic sent to pharmacy.

## 2016-01-26 ENCOUNTER — Encounter: Payer: Self-pay | Admitting: Pediatrics

## 2016-01-26 LAB — BMP8+EGFR
BUN / CREAT RATIO: 14 (ref 9–23)
BUN: 8 mg/dL (ref 6–20)
CHLORIDE: 92 mmol/L — AB (ref 96–106)
CO2: 22 mmol/L (ref 18–29)
Calcium: 9.8 mg/dL (ref 8.7–10.2)
Creatinine, Ser: 0.58 mg/dL (ref 0.57–1.00)
GFR calc Af Amer: 153 mL/min/{1.73_m2} (ref >59)
GFR calc non Af Amer: 133 mL/min/{1.73_m2} (ref >59)
GLUCOSE: 344 mg/dL — AB (ref 65–99)
POTASSIUM: 4.5 mmol/L (ref 3.5–5.2)
SODIUM: 134 mmol/L (ref 134–144)

## 2016-05-12 ENCOUNTER — Emergency Department (HOSPITAL_COMMUNITY)
Admission: EM | Admit: 2016-05-12 | Discharge: 2016-05-12 | Disposition: A | Discharge status: Home / Self Care | Payer: Medicaid Other | Attending: Emergency Medicine | Admitting: Emergency Medicine

## 2016-05-12 ENCOUNTER — Encounter (HOSPITAL_COMMUNITY): Payer: Self-pay

## 2016-05-12 ENCOUNTER — Emergency Department (HOSPITAL_COMMUNITY): Payer: Medicaid Other

## 2016-05-12 DIAGNOSIS — E119 Type 2 diabetes mellitus without complications: Secondary | ICD-10-CM | POA: Diagnosis not present

## 2016-05-12 DIAGNOSIS — Z794 Long term (current) use of insulin: Secondary | ICD-10-CM | POA: Diagnosis not present

## 2016-05-12 DIAGNOSIS — J40 Bronchitis, not specified as acute or chronic: Secondary | ICD-10-CM

## 2016-05-12 DIAGNOSIS — Z79899 Other long term (current) drug therapy: Secondary | ICD-10-CM | POA: Insufficient documentation

## 2016-05-12 DIAGNOSIS — F17210 Nicotine dependence, cigarettes, uncomplicated: Secondary | ICD-10-CM | POA: Diagnosis not present

## 2016-05-12 DIAGNOSIS — R05 Cough: Secondary | ICD-10-CM | POA: Diagnosis present

## 2016-05-12 LAB — I-STAT CHEM 8, ED
BUN: 7 mg/dL (ref 6–20)
CALCIUM ION: 1.19 mmol/L (ref 1.15–1.40)
Chloride: 98 mmol/L — ABNORMAL LOW (ref 101–111)
Creatinine, Ser: 0.6 mg/dL (ref 0.44–1.00)
Glucose, Bld: 97 mg/dL (ref 65–99)
HCT: 42 % (ref 36.0–46.0)
Hemoglobin: 14.3 g/dL (ref 12.0–15.0)
Potassium: 3.6 mmol/L (ref 3.5–5.1)
SODIUM: 136 mmol/L (ref 135–145)
TCO2: 27 mmol/L (ref 0–100)

## 2016-05-12 LAB — CBG MONITORING, ED: GLUCOSE-CAPILLARY: 109 mg/dL — AB (ref 65–99)

## 2016-05-12 LAB — I-STAT BETA HCG BLOOD, ED (MC, WL, AP ONLY): I-stat hCG, quantitative: <5 m[IU]/mL (ref <5)

## 2016-05-12 MED ORDER — AZITHROMYCIN 250 MG PO TABS: 250.0000 mg | ORAL_TABLET | Freq: Every day | ORAL | 0 refills | Status: DC

## 2016-05-12 NOTE — ED Triage Notes (Addendum)
I have been sick for a week with a cough, sneezing, headaches, and dizziness.  I have felt like I was running a fever.  I have also been vomiting since yesterday.  Patient states that she went to urgent care and was tested for the flu and they sent her here.

## 2016-05-12 NOTE — ED Notes (Signed)
Pt reports she tested negative for the flu at urgent care.

## 2016-05-12 NOTE — Discharge Instructions (Signed)
Drink plenty of fluids. Take Tylenol or Motrin for aches or fever. Follow-up not improving

## 2016-05-12 NOTE — ED Provider Notes (Signed)
[facility] Provider Note   CSN: [id number] Arrival date & time: 05/12/16  1948     History   Chief Complaint Chief Complaint  Patient presents with  . Influenza    HPI Shelley Baldwin is a 21 y.o. female.  Patient complains of a cough for the last week. She was seen in the urine care and had negative flu   The history is provided by the patient. No language interpreter was used.  Influenza  Presenting symptoms: cough   Presenting symptoms: no diarrhea, no fatigue and no headaches   Severity:  Moderate Onset quality:  Sudden Progression:  Worsening Chronicity:  New Relieved by:  Nothing Associated symptoms: no congestion     Past Medical History:  Diagnosis Date  . Diabetes mellitus without complication (HCC)   . Nasal turbinate hypertrophy 07/2013    Patient Active Problem List   Diagnosis Date Noted  . Type 2 diabetes mellitus (HCC) 10/12/2015  . Elevated blood pressure 10/12/2015  . Morbid obesity (HCC) 10/12/2015  . IUD (intrauterine device) in place 10/12/2015  . Tobacco abuse 10/12/2015    Past Surgical History:  Procedure Laterality Date  . ADENOIDECTOMY    . TURBINATE REDUCTION     x 3, per mother  . TURBINATE REDUCTION Bilateral 08/05/2013   Procedure: BILATERAL TURBINATE REDUCTION;  Surgeon: Darletta Moll, MD;  Location: [facility];  Service: ENT;  Laterality: Bilateral;    OB History    No data available       Home Medications    Prior to Admission medications   Medication Sig Start Date End Date Taking? Authorizing Provider  albuterol (PROVENTIL HFA;VENTOLIN HFA) 108 (90 Base) MCG/ACT inhaler Inhale 1-2 puffs into the lungs every 6 (six) hours as needed for wheezing or shortness of breath.   Yes Historical Provider, MD  glucose blood (ACCU-CHEK GUIDE) test strip Use as 3 times daily 01/18/16  Yes Johna Sheriff, MD  Insulin Glargine (LANTUS SOLOSTAR) 100 UNIT/ML Solostar Pen Inject 20 Units into the skin 2 (two) times  daily. 01/25/16  Yes Johna Sheriff, MD  Insulin Pen Needle (EXEL COMFORT POINT PEN NEEDLE) 31G X 4 MM MISC 1 application by Does not apply route daily. 11/02/15  Yes Johna Sheriff, MD  Lancets (ACCU-CHEK MULTICLIX) lancets Use as instructed 09/19/15  Yes Mechele Claude, MD  sitaGLIPtin-metformin (JANUMET) 50-1000 MG tablet Take 1 tablet by mouth 2 (two) times daily with a meal. 01/15/16  Yes Elige Radon Dettinger, MD  azithromycin (ZITHROMAX) 250 MG tablet Take 1 tablet (250 mg total) by mouth daily. Take first 2 tablets together, then 1 every day until finished. 05/12/16   Bethann Berkshire, MD  cetirizine (ZYRTEC) 10 MG tablet Take 1 tablet (10 mg total) by mouth daily. 12/27/15   Vanetta Mulders, MD  metroNIDAZOLE (FLAGYL) 500 MG tablet Take 1 tablet (500 mg total) by mouth 3 (three) times daily. Patient not taking: Reported on 05/12/2016 01/25/16   Johna Sheriff, MD  traMADol (ULTRAM) 50 MG tablet Take 1 tablet (50 mg total) by mouth every 8 (eight) hours as needed. Patient not taking: Reported on 05/12/2016 01/15/16   Elige Radon Dettinger, MD    Family History Family History  Problem Relation Age of Onset  . Diabetes Mother     Social History Social History  Substance Use Topics  . Smoking status: Current Every Day Smoker    Years: 2.00    Types: Cigarettes  . Smokeless tobacco: Never  Used     Comment: 5-6 cig./day  . Alcohol use Yes     Comment: occasionally     Allergies   Milk-related compounds   Review of Systems Review of Systems  Constitutional: Negative for appetite change and fatigue.  HENT: Negative for congestion, ear discharge and sinus pressure.   Eyes: Negative for discharge.  Respiratory: Positive for cough.   Cardiovascular: Negative for chest pain.  Gastrointestinal: Negative for abdominal pain and diarrhea.  Genitourinary: Negative for frequency and hematuria.  Musculoskeletal: Negative for back pain.  Skin: Negative for rash.  Neurological: Negative for seizures  and headaches.  Psychiatric/Behavioral: Negative for hallucinations.     Physical Exam Updated Vital Signs BP 137/76 (BP Location: Left Arm)   Pulse 110   Temp 98.6 F (37 C) (Oral)   Resp 16   Ht 5\' 7"  (1.702 m)   Wt 270 lb (122.5 kg)   SpO2 100%   BMI 42.29 kg/m   Physical Exam  Constitutional: She is oriented to person, place, and time. She appears well-developed.  HENT:  Head: Normocephalic.  Eyes: Conjunctivae and EOM are normal. No scleral icterus.  Neck: Neck supple. No thyromegaly present.  Cardiovascular: Normal rate and regular rhythm.  Exam reveals no gallop and no friction rub.   No murmur heard. Pulmonary/Chest: No stridor. She has no wheezes. She has no rales. She exhibits no tenderness.  Abdominal: She exhibits no distension. There is no tenderness. There is no rebound.  Musculoskeletal: Normal range of motion. She exhibits no edema.  Lymphadenopathy:    She has no cervical adenopathy.  Neurological: She is oriented to person, place, and time. She exhibits normal muscle tone. Coordination normal.  Skin: No rash noted. No erythema.  Psychiatric: She has a normal mood and affect. Her behavior is normal.     ED Treatments / Results  Labs (all labs ordered are listed, but only abnormal results are displayed) Labs Reviewed  CBG MONITORING, ED - Abnormal; Notable for the following:       Result Value   Glucose-Capillary 109 (*)    All other components within normal limits  I-STAT CHEM 8, ED - Abnormal; Notable for the following:    Chloride 98 (*)    All other components within normal limits  I-STAT BETA HCG BLOOD, ED ([facility], [facility], [facility] ONLY)    EKG  EKG Interpretation None       Radiology Dg Chest 2 View  Result Date: 05/12/2016 CLINICAL DATA:  Productive cough and lightheadedness for 1 week EXAM: CHEST  2 VIEW COMPARISON:  01/05/2016 FINDINGS: The heart size and mediastinal contours are within normal limits. Both lungs are clear. The visualized skeletal  structures are unremarkable. IMPRESSION: No active cardiopulmonary disease. Electronically Signed   By: Ellery Plunkaniel R Mitchell M.D.   On: 05/12/2016 21:07    Procedures Procedures (including critical care time)  Medications Ordered in ED Medications - No data to display   Initial Impression / Assessment and Plan / ED Course  I have reviewed the triage vital signs and the nursing notes.  Pertinent labs & imaging results that were available during my care of the patient were reviewed by me and considered in my medical decision making (see chart for details).     Patient with persistent bronchitis. She was placed on Z-Pak and will follow-up with her doctor as needed  Final Clinical Impressions(s) / ED Diagnoses   Final diagnoses:  Bronchitis    New Prescriptions New Prescriptions  AZITHROMYCIN (ZITHROMAX) 250 MG TABLET    Take 1 tablet (250 mg total) by mouth daily. Take first 2 tablets together, then 1 every day until finished.     Bethann Berkshire, MD 05/12/16 2121

## 2016-05-20 ENCOUNTER — Ambulatory Visit: Payer: Self-pay | Admitting: Pediatrics

## 2016-06-17 ENCOUNTER — Telehealth: Payer: Self-pay | Admitting: Pharmacist

## 2016-06-17 NOTE — Telephone Encounter (Signed)
Patient called regarding f/u DM appt made for 06/21/16 with PCP.

## 2016-06-21 ENCOUNTER — Ambulatory Visit: Payer: Self-pay | Admitting: Pediatrics

## 2016-08-25 ENCOUNTER — Ambulatory Visit: Payer: Self-pay | Admitting: Nurse Practitioner

## 2016-10-24 ENCOUNTER — Encounter (HOSPITAL_COMMUNITY): Payer: Self-pay | Admitting: Emergency Medicine

## 2016-10-24 ENCOUNTER — Emergency Department (HOSPITAL_COMMUNITY): Payer: Self-pay

## 2016-10-24 ENCOUNTER — Emergency Department (HOSPITAL_COMMUNITY)
Admission: EM | Admit: 2016-10-24 | Discharge: 2016-10-24 | Disposition: A | Discharge status: Home / Self Care | Payer: Self-pay | Attending: Emergency Medicine | Admitting: Emergency Medicine

## 2016-10-24 DIAGNOSIS — Z794 Long term (current) use of insulin: Secondary | ICD-10-CM | POA: Insufficient documentation

## 2016-10-24 DIAGNOSIS — R05 Cough: Secondary | ICD-10-CM | POA: Insufficient documentation

## 2016-10-24 DIAGNOSIS — F17210 Nicotine dependence, cigarettes, uncomplicated: Secondary | ICD-10-CM | POA: Insufficient documentation

## 2016-10-24 DIAGNOSIS — E119 Type 2 diabetes mellitus without complications: Secondary | ICD-10-CM | POA: Insufficient documentation

## 2016-10-24 DIAGNOSIS — B9789 Other viral agents as the cause of diseases classified elsewhere: Secondary | ICD-10-CM

## 2016-10-24 DIAGNOSIS — J069 Acute upper respiratory infection, unspecified: Secondary | ICD-10-CM | POA: Insufficient documentation

## 2016-10-24 MED ORDER — GUAIFENESIN ER 600 MG PO TB12: 600.0000 mg | ORAL_TABLET | Freq: Two times a day (BID) | ORAL | 2 refills | Status: AC

## 2016-10-24 NOTE — ED Notes (Signed)
Patient states "my doctor put me on blood pressure medicine one time for about 2-3 months last year but never did refill my prescription so I don't take it anymore."

## 2016-10-24 NOTE — ED Provider Notes (Signed)
[facility] Provider Note   CSN: [id number] Arrival date & time: 10/24/16  0710     History   Chief Complaint Chief Complaint  Patient presents with  . Cough    HPI EMERA BUSSIE is a 21 y.o. female with a past medical history significant T2DM who presents today complaining of cough. Patient reports cough, congestion since the last Thursday. Patient also reports one episode of emesis on Friday. Patient also reports subjective fever, chills and mild wheezing and intermittent shortness of breath yesterday evening. Patient has taken Robitussin for her cough and Advil for her headaches. Patient denies any sick contacts, chest pain, abdominal pain, dysuria.   HPI  Past Medical History:  Diagnosis Date  . Diabetes mellitus without complication (HCC)   . Nasal turbinate hypertrophy 07/2013    Patient Active Problem List   Diagnosis Date Noted  . Type 2 diabetes mellitus (HCC) 10/12/2015  . Elevated blood pressure 10/12/2015  . Morbid obesity (HCC) 10/12/2015  . IUD (intrauterine device) in place 10/12/2015  . Tobacco abuse 10/12/2015    Past Surgical History:  Procedure Laterality Date  . ADENOIDECTOMY    . TURBINATE REDUCTION     x 3, per mother  . TURBINATE REDUCTION Bilateral 08/05/2013   Procedure: BILATERAL TURBINATE REDUCTION;  Surgeon: Darletta Moll, MD;  Location: [facility];  Service: ENT;  Laterality: Bilateral;    OB History    No data available       Home Medications    Prior to Admission medications   Medication Sig Start Date End Date Taking? Authorizing Provider  albuterol (PROVENTIL HFA;VENTOLIN HFA) 108 (90 Base) MCG/ACT inhaler Inhale 1-2 puffs into the lungs every 6 (six) hours as needed for wheezing or shortness of breath.    [provider]  azithromycin (ZITHROMAX) 250 MG tablet Take 1 tablet (250 mg total) by mouth daily. Take first 2 tablets together, then 1 every day until finished. 05/12/16   Bethann Berkshire, MD    cetirizine (ZYRTEC) 10 MG tablet Take 1 tablet (10 mg total) by mouth daily. 12/27/15   Vanetta Mulders, MD  glucose blood (ACCU-CHEK GUIDE) test strip Use as 3 times daily 01/18/16   Johna Sheriff, MD  guaiFENesin (MUCINEX) 600 MG 12 hr tablet Take 1 tablet (600 mg total) by mouth 2 (two) times daily. 10/24/16 10/24/17  Ha Shannahan, Lilia Argue, MD  Insulin Glargine (LANTUS SOLOSTAR) 100 UNIT/ML Solostar Pen Inject 20 Units into the skin 2 (two) times daily. 01/25/16   Johna Sheriff, MD  Insulin Pen Needle (EXEL COMFORT POINT PEN NEEDLE) 31G X 4 MM MISC 1 application by Does not apply route daily. 11/02/15   Johna Sheriff, MD  Lancets (ACCU-CHEK MULTICLIX) lancets Use as instructed 09/19/15   Mechele Claude, MD  metroNIDAZOLE (FLAGYL) 500 MG tablet Take 1 tablet (500 mg total) by mouth 3 (three) times daily. Patient not taking: Reported on 05/12/2016 01/25/16   Johna Sheriff, MD  sitaGLIPtin-metformin (JANUMET) 50-1000 MG tablet Take 1 tablet by mouth 2 (two) times daily with a meal. 01/15/16   Dettinger, Elige Radon, MD  traMADol (ULTRAM) 50 MG tablet Take 1 tablet (50 mg total) by mouth every 8 (eight) hours as needed. Patient not taking: Reported on 05/12/2016 01/15/16   Dettinger, Elige Radon, MD    Family History Family History  Problem Relation Age of Onset  . Diabetes Mother     Social History Social History  Substance Use Topics  . Smoking  status: Current Every Day Smoker    Years: 2.00    Types: Cigarettes  . Smokeless tobacco: Never Used     Comment: 5-6 cig./day  . Alcohol use Yes     Comment: occasionally     Allergies   Milk-related compounds   Review of Systems Review of Systems  Constitutional: Positive for chills, fatigue and fever.  HENT: Positive for congestion, postnasal drip, rhinorrhea and sneezing.   Eyes: Negative.   Respiratory: Positive for cough, shortness of breath and wheezing.   Cardiovascular: Negative.   Gastrointestinal: Positive for vomiting.   Endocrine: Negative.   Genitourinary: Negative.   Musculoskeletal: Positive for myalgias.  Skin: Negative.   Allergic/Immunologic: Negative.   Neurological: Negative.   Hematological: Negative.   Psychiatric/Behavioral: Negative.      Physical Exam Updated Vital Signs BP (!) 148/71 (BP Location: Left Arm)   Pulse 85   Temp 97.7 F (36.5 C) (Oral)   Resp 18   Ht 5\' 7"  (1.702 m)   Wt 126.1 kg (278 lb)   SpO2 98%   BMI 43.54 kg/m   Physical Exam  Constitutional: She is oriented to person, place, and time. She appears well-developed.  HENT:  Head: Normocephalic and atraumatic.  Mouth/Throat: Oropharynx is clear and moist.  Eyes: Pupils are equal, round, and reactive to light. EOM are normal.  Neck: Normal range of motion. Neck supple.  Cardiovascular: Normal rate and regular rhythm.   Pulmonary/Chest: Effort normal and breath sounds normal. No respiratory distress. She has no wheezes.  Moving air clearly up and lower lung field bilaterally  Abdominal: Soft. Bowel sounds are normal.  Musculoskeletal: Normal range of motion.  Neurological: She is alert and oriented to person, place, and time.  Skin: Skin is warm and dry.  Psychiatric: She has a normal mood and affect. Her behavior is normal.     ED Treatments / Results  Labs (all labs ordered are listed, but only abnormal results are displayed) Labs Reviewed - No data to display  EKG  EKG Interpretation None       Radiology Dg Chest 2 View  Result Date: 10/24/2016 CLINICAL DATA:  Cough for 1 week, initial encounter EXAM: CHEST  2 VIEW COMPARISON:  05/12/2016 FINDINGS: The heart size and mediastinal contours are within normal limits. Both lungs are clear. The visualized skeletal structures are unremarkable. IMPRESSION: No active cardiopulmonary disease. Electronically Signed   By: Alcide CleverMark  Lukens M.D.   On: 10/24/2016 07:56    Procedures Procedures (including critical care time)  Medications Ordered in  ED Medications - No data to display   Initial Impression / Assessment and Plan / ED Course  Patient is a 21 yo female who present with cough, congestion, rhinorrhea, myalgias for the past 4 days and subjective fever at home. On lung exam, patient is clear to auscultation no diminished breath sound or wheezing appreciated. Patient is afebrile with a mildly elevated BP rest of her vitals are stable. CXR showed no acute findings concerning for pneumonia. Given symptoms on presentation, imaging findings and exam more likely to be a viral URI exacerbated by the fact that patient is a smoker. Will manage symptoms and reassure patient that course can last up to week. If she continue to have fever, and worsening clinical picture please return to the ED. I have reviewed the triage vital signs and the nursing notes.  Pertinent labs & imaging results that were available during my care of the patient were reviewed by me and  considered in my medical decision making (see chart for details).     Final Clinical Impressions(s) / ED Diagnoses   Final diagnoses:  Viral URI with cough    New Prescriptions New Prescriptions   GUAIFENESIN (MUCINEX) 600 MG 12 HR TABLET    Take 1 tablet (600 mg total) by mouth 2 (two) times daily.     Lovena Neighbours, MD 10/24/16 1610    Blane Ohara, MD 10/26/16 [phone]

## 2016-10-24 NOTE — ED Triage Notes (Signed)
Patient complaining of cough and wheezing x 2 days.

## 2016-10-24 NOTE — Discharge Instructions (Signed)
Based on symptoms on presentation, lab results and imagine, it appears that you have viral infection of your upper airway. You will not require any antibiotic as this process is self resolving and last about 7-10 days.The cough can last up to two weeks.

## 2016-12-20 ENCOUNTER — Encounter (HOSPITAL_COMMUNITY): Payer: Self-pay

## 2016-12-20 ENCOUNTER — Emergency Department (HOSPITAL_COMMUNITY): Payer: Self-pay

## 2016-12-20 ENCOUNTER — Emergency Department (HOSPITAL_COMMUNITY)
Admission: EM | Admit: 2016-12-20 | Discharge: 2016-12-21 | Disposition: A | Discharge status: Home / Self Care | Payer: Self-pay | Attending: Emergency Medicine | Admitting: Emergency Medicine

## 2016-12-20 DIAGNOSIS — R6889 Other general symptoms and signs: Secondary | ICD-10-CM | POA: Insufficient documentation

## 2016-12-20 DIAGNOSIS — R05 Cough: Secondary | ICD-10-CM | POA: Insufficient documentation

## 2016-12-20 DIAGNOSIS — R197 Diarrhea, unspecified: Secondary | ICD-10-CM | POA: Insufficient documentation

## 2016-12-20 DIAGNOSIS — E119 Type 2 diabetes mellitus without complications: Secondary | ICD-10-CM | POA: Insufficient documentation

## 2016-12-20 DIAGNOSIS — F17210 Nicotine dependence, cigarettes, uncomplicated: Secondary | ICD-10-CM | POA: Insufficient documentation

## 2016-12-20 DIAGNOSIS — B349 Viral infection, unspecified: Secondary | ICD-10-CM | POA: Insufficient documentation

## 2016-12-20 DIAGNOSIS — R112 Nausea with vomiting, unspecified: Secondary | ICD-10-CM | POA: Insufficient documentation

## 2016-12-20 DIAGNOSIS — Z794 Long term (current) use of insulin: Secondary | ICD-10-CM | POA: Insufficient documentation

## 2016-12-20 DIAGNOSIS — R51 Headache: Secondary | ICD-10-CM | POA: Insufficient documentation

## 2016-12-20 DIAGNOSIS — R059 Cough, unspecified: Secondary | ICD-10-CM

## 2016-12-20 DIAGNOSIS — R519 Headache, unspecified: Secondary | ICD-10-CM

## 2016-12-20 LAB — COMPREHENSIVE METABOLIC PANEL
ALBUMIN: 3.4 g/dL — AB (ref 3.5–5.0)
ALK PHOS: 99 U/L (ref 38–126)
ALT: 28 U/L (ref 14–54)
AST: 29 U/L (ref 15–41)
Anion gap: 7 (ref 5–15)
BUN: 8 mg/dL (ref 6–20)
CALCIUM: 9.1 mg/dL (ref 8.9–10.3)
CO2: 25 mmol/L (ref 22–32)
CREATININE: 0.55 mg/dL (ref 0.44–1.00)
Chloride: 100 mmol/L — ABNORMAL LOW (ref 101–111)
GFR calc Af Amer: >60 mL/min (ref >60)
GLUCOSE: 180 mg/dL — AB (ref 65–99)
Potassium: 3.7 mmol/L (ref 3.5–5.1)
Sodium: 132 mmol/L — ABNORMAL LOW (ref 135–145)
Total Bilirubin: 0.5 mg/dL (ref 0.3–1.2)
Total Protein: 8.5 g/dL — ABNORMAL HIGH (ref 6.5–8.1)

## 2016-12-20 LAB — CBC
HEMATOCRIT: 38.6 % (ref 36.0–46.0)
Hemoglobin: 13.2 g/dL (ref 12.0–15.0)
MCH: 29.9 pg (ref 26.0–34.0)
MCHC: 34.2 g/dL (ref 30.0–36.0)
MCV: 87.3 fL (ref 78.0–100.0)
PLATELETS: 206 10*3/uL (ref 150–400)
RBC: 4.42 MIL/uL (ref 3.87–5.11)
RDW: 12.3 % (ref 11.5–15.5)
WBC: 4.1 10*3/uL (ref 4.0–10.5)

## 2016-12-20 LAB — LIPASE, BLOOD: Lipase: 25 U/L (ref 11–51)

## 2016-12-20 LAB — INFLUENZA PANEL BY PCR (TYPE A & B)
Influenza A By PCR: NEGATIVE
Influenza B By PCR: NEGATIVE

## 2016-12-20 LAB — RAPID STREP SCREEN (MED CTR MEBANE ONLY): STREPTOCOCCUS, GROUP A SCREEN (DIRECT): NEGATIVE

## 2016-12-20 MED ORDER — PROCHLORPERAZINE EDISYLATE 5 MG/ML IJ SOLN
5.0000 mg | Freq: Once | INTRAMUSCULAR | Status: AC
  Administered 2016-12-21: 5 mg via INTRAVENOUS
  Filled 2016-12-20: qty 2

## 2016-12-20 MED ORDER — KETOROLAC TROMETHAMINE 30 MG/ML IJ SOLN
30.0000 mg | Freq: Once | INTRAMUSCULAR | Status: AC
  Administered 2016-12-20: 30 mg via INTRAVENOUS
  Filled 2016-12-20: qty 1

## 2016-12-20 MED ORDER — SODIUM CHLORIDE 0.9 % IV BOLUS (SEPSIS)
1000.0000 mL | Freq: Once | INTRAVENOUS | Status: AC
  Administered 2016-12-20: 1000 mL via INTRAVENOUS

## 2016-12-20 MED ORDER — DIPHENHYDRAMINE HCL 50 MG/ML IJ SOLN
25.0000 mg | Freq: Once | INTRAMUSCULAR | Status: AC
  Administered 2016-12-21: 25 mg via INTRAVENOUS
  Filled 2016-12-20: qty 1

## 2016-12-20 NOTE — ED Notes (Signed)
Checked with pt for urine sample,pt doesn't have to go right now with try back in 30 minutes.

## 2016-12-20 NOTE — ED Notes (Signed)
Checked back with patient for urine sample,still can't go right now will check back in 30 minutes.

## 2016-12-20 NOTE — ED Provider Notes (Signed)
[facility] Provider Note   CSN: [id number] Arrival date & time: 12/20/16  2128     History   Chief Complaint Chief Complaint  Patient presents with  . Generalized Body Aches    HPI Shelley Baldwin is a 21 y.o. female with history of diabetes who presents with a 2 day history of fever, cough, vomiting, diarrhea, intermittent abdominal pain, generalized body aches, headache. Patient reports she began with just cough and nasal congestion, however the other symptoms have progressed. She has taken Robitussin, Mucinex, ibuprofen, TheraFlu, all without significant relief. She denies any chest pain or shortness of breath. LMP 12/20/2016.  HPI  Past Medical History:  Diagnosis Date  . Diabetes mellitus without complication (HCC)   . Nasal turbinate hypertrophy 07/2013    Patient Active Problem List   Diagnosis Date Noted  . Type 2 diabetes mellitus (HCC) 10/12/2015  . Elevated blood pressure 10/12/2015  . Morbid obesity (HCC) 10/12/2015  . IUD (intrauterine device) in place 10/12/2015  . Tobacco abuse 10/12/2015    Past Surgical History:  Procedure Laterality Date  . ADENOIDECTOMY    . TURBINATE REDUCTION     x 3, per mother  . TURBINATE REDUCTION Bilateral 08/05/2013   Procedure: BILATERAL TURBINATE REDUCTION;  Surgeon: Darletta Moll, MD;  Location: [facility];  Service: ENT;  Laterality: Bilateral;    OB History    No data available       Home Medications    Prior to Admission medications   Medication Sig Start Date End Date Taking? Authorizing Provider  guaiFENesin (MUCINEX) 600 MG 12 hr tablet Take 1 tablet (600 mg total) by mouth 2 (two) times daily. Patient taking differently: Take 600 mg by mouth 2 (two) times daily as needed for cough or to loosen phlegm.  10/24/16 10/24/17 Yes Diallo, Lilia Argue, MD  Insulin Glargine (LANTUS SOLOSTAR) 100 UNIT/ML Solostar Pen Inject 20 Units into the skin 2 (two) times daily. Patient taking differently: Inject 15  Units into the skin 2 (two) times daily.  01/25/16  Yes Johna Sheriff, MD  sitaGLIPtin-metformin (JANUMET) 50-1000 MG tablet Take 1 tablet by mouth 2 (two) times daily with a meal. 01/15/16  Yes Dettinger, Elige Radon, MD  albuterol (PROVENTIL HFA;VENTOLIN HFA) 108 (90 Base) MCG/ACT inhaler Inhale 1-2 puffs into the lungs every 6 (six) hours as needed for wheezing or shortness of breath.    [provider]  glucose blood (ACCU-CHEK GUIDE) test strip Use as 3 times daily 01/18/16   Johna Sheriff, MD  Insulin Pen Needle (EXEL COMFORT POINT PEN NEEDLE) 31G X 4 MM MISC 1 application by Does not apply route daily. 11/02/15   Johna Sheriff, MD  Lancets (ACCU-CHEK MULTICLIX) lancets Use as instructed 09/19/15   Mechele Claude, MD    Family History Family History  Problem Relation Age of Onset  . Diabetes Mother     Social History Social History  Substance Use Topics  . Smoking status: Current Every Day Smoker    Packs/day: 0.50    Years: 2.00    Types: Cigarettes  . Smokeless tobacco: Never Used     Comment: 5-6 cig./day  . Alcohol use Yes     Comment: occasionally     Allergies   Milk-related compounds   Review of Systems Review of Systems  Constitutional: Positive for appetite change and fever. Negative for chills.  HENT: Positive for congestion and sore throat. Negative for ear pain and facial swelling.  Respiratory: Positive for cough. Negative for shortness of breath.   Cardiovascular: Negative for chest pain.  Gastrointestinal: Positive for abdominal pain, diarrhea, nausea and vomiting. Negative for blood in stool.  Genitourinary: Negative for dysuria, frequency and urgency.  Musculoskeletal: Positive for myalgias. Negative for back pain.  Skin: Negative for rash and wound.  Neurological: Positive for headaches.  Psychiatric/Behavioral: The patient is not nervous/anxious.      Physical Exam Updated Vital Signs BP 126/83   Pulse 84   Temp 98.9 F (37.2 C)  (Oral)   Resp 19   Ht 5\' 7"  (1.702 m)   Wt 127 kg (280 lb)   LMP 12/20/2016 (LMP Unknown)   SpO2 99%   BMI 43.85 kg/m   Physical Exam  Constitutional: She appears well-developed and well-nourished. No distress.  HENT:  Head: Normocephalic and atraumatic.  Mouth/Throat: Posterior oropharyngeal erythema (mild) present. No oropharyngeal exudate, posterior oropharyngeal edema or tonsillar abscesses.  Eyes: Pupils are equal, round, and reactive to light. Conjunctivae are normal. Right eye exhibits no discharge. Left eye exhibits no discharge. No scleral icterus.  Neck: Normal range of motion. Neck supple. No thyromegaly present.  Cardiovascular: Normal rate, regular rhythm, normal heart sounds and intact distal pulses.  Exam reveals no gallop and no friction rub.   No murmur heard. Pulmonary/Chest: Effort normal and breath sounds normal. No stridor. No respiratory distress. She has no wheezes. She has no rales.  Abdominal: Soft. Bowel sounds are normal. She exhibits no distension. There is tenderness in the right upper quadrant. There is no rebound and no guarding.  Musculoskeletal: She exhibits no edema.  Lymphadenopathy:    She has no cervical adenopathy.  Neurological: She is alert. Coordination normal.  CN 3-12 intact; normal sensation throughout; 5/5 strength in all 4 extremities; equal bilateral grip strength  Skin: Skin is warm and dry. No rash noted. She is not diaphoretic. No pallor.  Psychiatric: She has a normal mood and affect.  Nursing note and vitals reviewed.    ED Treatments / Results  Labs (all labs ordered are listed, but only abnormal results are displayed) Labs Reviewed  COMPREHENSIVE METABOLIC PANEL - Abnormal; Notable for the following:       Result Value   Sodium 132 (*)    Chloride 100 (*)    Glucose, Bld 180 (*)    Total Protein 8.5 (*)    Albumin 3.4 (*)    All other components within normal limits  RAPID STREP SCREEN (NOT AT [facility])  CULTURE, GROUP A  STREP ([facility])  LIPASE, BLOOD  CBC  INFLUENZA PANEL BY PCR (TYPE A & B)  URINALYSIS, ROUTINE W REFLEX MICROSCOPIC  PREGNANCY, URINE    EKG  EKG Interpretation None       Radiology Dg Chest 2 View  Result Date: 12/20/2016 CLINICAL DATA:  Cough and fever x2 days with generalized body ache. EXAM: CHEST  2 VIEW COMPARISON:  10/24/2016 FINDINGS: The heart size and mediastinal contours are within normal limits. No pneumonic consolidation, effusion or pneumothorax. The visualized skeletal structures are unremarkable. IMPRESSION: No active cardiopulmonary disease. Electronically Signed   By: Tollie Ethavid  Kwon M.D.   On: 12/20/2016 23:11    Procedures Procedures (including critical care time)  Medications Ordered in ED Medications  sodium chloride 0.9 % bolus 1,000 mL (0 mLs Intravenous Stopped 12/21/16 0009)  ketorolac (TORADOL) 30 MG/ML injection 30 mg (30 mg Intravenous Given 12/20/16 2218)  prochlorperazine (COMPAZINE) injection 5 mg (5 mg Intravenous Given 12/21/16 0006)  diphenhydrAMINE (BENADRYL) injection 25 mg (25 mg Intravenous Given 12/21/16 0008)     Initial Impression / Assessment and Plan / ED Course  I have reviewed the triage vital signs and the nursing notes.  Pertinent labs & imaging results that were available during my care of the patient were reviewed by me and considered in my medical decision making (see chart for details).     Suspect viral syndrome. On reevaluation after Toradol and fluids, patient no longer having any right upper quadrant tenderness or pain. RUQ [place] initially ordered, but [place] unavailable. Considering patient's resolved pain and constellation of symptoms, I feel intra-abdominal emergent etiology is unlikely and no indication for patient to return tomorrow for study. Benign abdominal exam at discharge. Labs are unremarkable. Suspect viral syndrome. Chest x-ray unremarkable. Influenza A&B negative. Rapid strep negative. Will treat supportively with Zofran,  Tessalon, ibuprofen, Tylenol, fluids. Follow-up to PCP. Strict return precautions discussed. Patient understands and agrees with plan. Patient vitals stable throughout ED course and discharged in satisfactory condition. I discussed patient case with Dr. Jacqulyn Bath who guided the patient's management and agrees with plan.   Final Clinical Impressions(s) / ED Diagnoses   Final diagnoses:  Viral syndrome  Flu-like symptoms  Cough  Nausea vomiting and diarrhea  Bad headache    New Prescriptions New Prescriptions   No medications on file     Verdis Prime 12/21/16 Saunders Revel, MD 12/21/16 [phone]

## 2016-12-20 NOTE — ED Triage Notes (Addendum)
Reports of generalized body aches, vomiting, cough and fever x2 days.

## 2016-12-21 LAB — PREGNANCY, URINE: PREG TEST UR: NEGATIVE

## 2016-12-21 LAB — URINALYSIS, ROUTINE W REFLEX MICROSCOPIC
BILIRUBIN URINE: NEGATIVE
Glucose, UA: NEGATIVE mg/dL
Hgb urine dipstick: NEGATIVE
KETONES UR: NEGATIVE mg/dL
LEUKOCYTES UA: NEGATIVE
NITRITE: NEGATIVE
Protein, ur: NEGATIVE mg/dL
SPECIFIC GRAVITY, URINE: 1.016 (ref 1.005–1.030)
pH: 6 (ref 5.0–8.0)

## 2016-12-21 MED ORDER — IBUPROFEN 800 MG PO TABS: 800.0000 mg | ORAL_TABLET | Freq: Three times a day (TID) | ORAL | 0 refills | Status: DC

## 2016-12-21 MED ORDER — ACETAMINOPHEN 500 MG PO TABS: 500.0000 mg | ORAL_TABLET | Freq: Four times a day (QID) | ORAL | 0 refills | Status: DC | PRN

## 2016-12-21 MED ORDER — ONDANSETRON HCL 4 MG PO TABS: 4.0000 mg | ORAL_TABLET | Freq: Four times a day (QID) | ORAL | 0 refills | Status: DC

## 2016-12-21 MED ORDER — BENZONATATE 100 MG PO CAPS: 100.0000 mg | ORAL_CAPSULE | Freq: Three times a day (TID) | ORAL | 0 refills | Status: DC

## 2016-12-21 NOTE — Discharge Instructions (Signed)
Medications: Zofran, Tessalon, ibuprofen, Tylenol   Treatment: Take Zofran every 6 hours as needed for nausea or vomiting. Take Tessalon every 8 hours as needed for cough. Take ibuprofen and Tylenol as prescribed as needed for headache, fever, or body aches. Make sure to take ibuprofen with food and do not take if you are vomiting or have been vomiting, as this medicine is hard on your stomach.  Follow-up: Please follow-up with your doctor or further evaluation and treatment of your symptoms if they are continuing. We think you may have a virus. Please return to the emergency department if you develop any new or worsening symptoms including difficulty breathing, severe pain in your abdomen, intractable vomiting, passing out, seizure like activity, confusion, or any other concerning symptoms.

## 2016-12-23 LAB — CULTURE, GROUP A STREP (THRC)

## 2016-12-26 ENCOUNTER — Telehealth: Payer: Self-pay | Admitting: Pediatrics

## 2017-02-13 ENCOUNTER — Emergency Department (HOSPITAL_COMMUNITY): Payer: Self-pay

## 2017-02-13 ENCOUNTER — Encounter (HOSPITAL_COMMUNITY): Payer: Self-pay | Admitting: Emergency Medicine

## 2017-02-13 ENCOUNTER — Emergency Department (HOSPITAL_COMMUNITY)
Admission: EM | Admit: 2017-02-13 | Discharge: 2017-02-13 | Disposition: A | Discharge status: Home / Self Care | Payer: Self-pay | Attending: Emergency Medicine | Admitting: Emergency Medicine

## 2017-02-13 DIAGNOSIS — J069 Acute upper respiratory infection, unspecified: Secondary | ICD-10-CM | POA: Insufficient documentation

## 2017-02-13 DIAGNOSIS — E119 Type 2 diabetes mellitus without complications: Secondary | ICD-10-CM | POA: Insufficient documentation

## 2017-02-13 DIAGNOSIS — F17210 Nicotine dependence, cigarettes, uncomplicated: Secondary | ICD-10-CM | POA: Insufficient documentation

## 2017-02-13 DIAGNOSIS — B9789 Other viral agents as the cause of diseases classified elsewhere: Secondary | ICD-10-CM | POA: Insufficient documentation

## 2017-02-13 DIAGNOSIS — Z794 Long term (current) use of insulin: Secondary | ICD-10-CM | POA: Insufficient documentation

## 2017-02-13 DIAGNOSIS — Z79899 Other long term (current) drug therapy: Secondary | ICD-10-CM | POA: Insufficient documentation

## 2017-02-13 MED ORDER — ALBUTEROL SULFATE HFA 108 (90 BASE) MCG/ACT IN AERS
2.0000 | INHALATION_SPRAY | Freq: Once | RESPIRATORY_TRACT | Status: AC
  Administered 2017-02-13: 2 via RESPIRATORY_TRACT
  Filled 2017-02-13: qty 6.7

## 2017-02-13 NOTE — Discharge Instructions (Signed)
Continue using your home medicines as discussed (alka seltzer cold and cough).  You may use the albuterol inhaler 2 puffs every 4 hours if you are coughing or wheezing.  Rest to make sure you are drinking plenty of fluids.  Chest x-ray is clear today, no sign of pneumonia or symptoms suggesting need for an antibiotic at this time.

## 2017-02-13 NOTE — ED Provider Notes (Signed)
[facility] Provider Note   CSN: [id number] Arrival date & time: 02/13/17  1107     History   Chief Complaint Chief Complaint  Patient presents with  . Cough    HPI Shelley Baldwin is a 21 y.o. female presenting with a 4 day history of uri type symptoms which includes nasal congestion with clear rhinorrhea, sore throat, low grade fever, postnasal drip and cough which has been been productive of copious clear sputum production.  She reports one episode of posttussive emesis yesterday evening but no persistent nausea.  Symptoms do not include shortness of breath, chest pain,  Nausea, vomiting or diarrhea.  The patient has taken Tylenol Cold and flu and then tried Alka-Seltzer cold and cough formula with some improvement using the Alka-Seltzer product.  She reports her breathing feels tight at night, has not noticed specifically wheezing, but endorses she has had similar symptoms in the past with viral infections which have responded to albuterol in the past, however she cannot find her albuterol inhaler.  The history is provided by the patient.    Past Medical History:  Diagnosis Date  . Diabetes mellitus without complication (HCC)   . Nasal turbinate hypertrophy 07/2013    Patient Active Problem List   Diagnosis Date Noted  . Type 2 diabetes mellitus (HCC) 10/12/2015  . Elevated blood pressure 10/12/2015  . Morbid obesity (HCC) 10/12/2015  . IUD (intrauterine device) in place 10/12/2015  . Tobacco abuse 10/12/2015    Past Surgical History:  Procedure Laterality Date  . ADENOIDECTOMY    . TURBINATE REDUCTION     x 3, per mother    OB History    Gravida Para Term Preterm AB Living   1             SAB TAB Ectopic Multiple Live Births                   Home Medications    Prior to Admission medications   Medication Sig Start Date End Date Taking? Authorizing Provider  acetaminophen (TYLENOL) 500 MG tablet Take 1 tablet (500 mg total) by mouth  every 6 (six) hours as needed. 12/21/16   Law, Waylan BogaAlexandra M, PA-C  albuterol (PROVENTIL HFA;VENTOLIN HFA) 108 (90 Base) MCG/ACT inhaler Inhale 1-2 puffs into the lungs every 6 (six) hours as needed for wheezing or shortness of breath.    [provider]  benzonatate (TESSALON) 100 MG capsule Take 1 capsule (100 mg total) by mouth every 8 (eight) hours. 12/21/16   Law, Waylan BogaAlexandra M, PA-C  glucose blood (ACCU-CHEK GUIDE) test strip Use as 3 times daily 01/18/16   Johna SheriffVincent, Carol L, MD  guaiFENesin (MUCINEX) 600 MG 12 hr tablet Take 1 tablet (600 mg total) by mouth 2 (two) times daily. Patient taking differently: Take 600 mg by mouth 2 (two) times daily as needed for cough or to loosen phlegm.  10/24/16 10/24/17  Diallo, Lilia ArgueAbdoulaye, MD  ibuprofen (ADVIL,MOTRIN) 800 MG tablet Take 1 tablet (800 mg total) by mouth 3 (three) times daily. 12/21/16   Law, Waylan BogaAlexandra M, PA-C  Insulin Glargine (LANTUS SOLOSTAR) 100 UNIT/ML Solostar Pen Inject 20 Units into the skin 2 (two) times daily. Patient taking differently: Inject 15 Units into the skin 2 (two) times daily.  01/25/16   Johna SheriffVincent, Carol L, MD  Insulin Pen Needle (EXEL COMFORT POINT PEN NEEDLE) 31G X 4 MM MISC 1 application by Does not apply route daily. 11/02/15   Johna SheriffVincent, Carol L,  MD  Lancets (ACCU-CHEK MULTICLIX) lancets Use as instructed 09/19/15   Mechele Claude, MD  ondansetron (ZOFRAN) 4 MG tablet Take 1 tablet (4 mg total) by mouth every 6 (six) hours. 12/21/16   Law, Waylan Boga, PA-C  sitaGLIPtin-metformin (JANUMET) 50-1000 MG tablet Take 1 tablet by mouth 2 (two) times daily with a meal. 01/15/16   Dettinger, Elige Radon, MD    Family History Family History  Problem Relation Age of Onset  . Diabetes Mother     Social History Social History   Tobacco Use  . Smoking status: Current Every Day Smoker    Packs/day: 0.50    Years: 2.00    Pack years: 1.00    Types: Cigarettes  . Smokeless tobacco: Never Used  . Tobacco comment: 5-6 cig./day    Substance Use Topics  . Alcohol use: Yes    Comment: occasionally  . Drug use: No     Allergies   Milk-related compounds   Review of Systems Review of Systems  Constitutional: Negative for chills and fever.  HENT: Positive for congestion, postnasal drip, rhinorrhea and sore throat. Negative for ear pain, sinus pressure, trouble swallowing and voice change.   Eyes: Negative for discharge.  Respiratory: Positive for cough, chest tightness and shortness of breath. Negative for wheezing and stridor.   Cardiovascular: Negative for chest pain.  Gastrointestinal: Positive for vomiting. Negative for abdominal pain.  Genitourinary: Negative.      Physical Exam Updated Vital Signs BP 132/74 (BP Location: Right Arm)   Pulse 92   Temp 98.3 F (36.8 C) (Oral)   Resp 18   Ht 5\' 7"  (1.702 m)   Wt 127 kg (280 lb)   SpO2 99%   BMI 43.85 kg/m   Physical Exam  Constitutional: She is oriented to person, place, and time. She appears well-developed and well-nourished.  HENT:  Head: Normocephalic and atraumatic.  Right Ear: Tympanic membrane and ear canal normal.  Left Ear: Tympanic membrane and ear canal normal.  Nose: Mucosal edema and rhinorrhea present.  Mouth/Throat: Uvula is midline, oropharynx is clear and moist and mucous membranes are normal. No oropharyngeal exudate, posterior oropharyngeal edema, posterior oropharyngeal erythema or tonsillar abscesses.  Eyes: Conjunctivae are normal.  Cardiovascular: Normal rate and normal heart sounds.  Pulmonary/Chest: Effort normal and breath sounds normal. No respiratory distress. She has no wheezes. She has no rales.  Abdominal: Soft. There is no tenderness.  Musculoskeletal: Normal range of motion.  Neurological: She is alert and oriented to person, place, and time.  Skin: Skin is warm and dry. No rash noted.  Psychiatric: She has a normal mood and affect.     ED Treatments / Results  Labs (all labs ordered are listed, but only  abnormal results are displayed) Labs Reviewed - No data to display  EKG  EKG Interpretation None       Radiology Dg Chest 2 View  Result Date: 02/13/2017 CLINICAL DATA:  Productive cough and congestion/diabetic/smoker EXAM: CHEST  2 VIEW COMPARISON:  12/20/2016 FINDINGS: Normal mediastinum and cardiac silhouette. Normal pulmonary vasculature. No evidence of effusion, infiltrate, or pneumothorax. No acute bony abnormality. IMPRESSION: No acute cardiopulmonary process. Electronically Signed   By: Genevive Bi M.D.   On: 02/13/2017 11:41    Procedures Procedures (including critical care time)  Medications Ordered in ED Medications  albuterol (PROVENTIL HFA;VENTOLIN HFA) 108 (90 Base) MCG/ACT inhaler 2 puff (2 puffs Inhalation Given 02/13/17 1324)     Initial Impression / Assessment and Plan /  ED Course  I have reviewed the triage vital signs and the nursing notes.  Pertinent labs & imaging results that were available during my care of the patient were reviewed by me and considered in my medical decision making (see chart for details).     Imaging reviewed, no pneumonia on x-ray.  She was encouraged to continue with her current home medications.  She was given an albuterol MDI with instructions and a spacer.  Rest, increase fluid intake, as needed follow-up for any worsening or persistent symptoms.  Final Clinical Impressions(s) / ED Diagnoses   Final diagnoses:  Viral upper respiratory tract infection    ED Discharge Orders    None       Victoriano Lain 02/13/17 1333    Eber Hong, MD 02/14/17 [phone]

## 2017-02-13 NOTE — ED Triage Notes (Signed)
PT c/o productive green sputum cough, body aches, sore throat x4 days unrelieved by OTC tyelnol (sinus), cold and congestion medicines.

## 2017-02-16 ENCOUNTER — Telehealth: Payer: Self-pay | Admitting: *Deleted

## 2017-02-16 NOTE — Telephone Encounter (Signed)
Called patient.  Patient states that she lost her insurance and can not afford to come in at this time.

## 2017-02-25 ENCOUNTER — Encounter (HOSPITAL_COMMUNITY): Payer: Self-pay | Admitting: Emergency Medicine

## 2017-02-25 ENCOUNTER — Emergency Department (HOSPITAL_COMMUNITY)
Admission: EM | Admit: 2017-02-25 | Discharge: 2017-02-25 | Disposition: A | Discharge status: Home / Self Care | Payer: Self-pay | Attending: Emergency Medicine | Admitting: Emergency Medicine

## 2017-02-25 DIAGNOSIS — B372 Candidiasis of skin and nail: Secondary | ICD-10-CM | POA: Insufficient documentation

## 2017-02-25 DIAGNOSIS — R739 Hyperglycemia, unspecified: Secondary | ICD-10-CM

## 2017-02-25 DIAGNOSIS — E1165 Type 2 diabetes mellitus with hyperglycemia: Secondary | ICD-10-CM | POA: Insufficient documentation

## 2017-02-25 DIAGNOSIS — L304 Erythema intertrigo: Secondary | ICD-10-CM

## 2017-02-25 DIAGNOSIS — Z794 Long term (current) use of insulin: Secondary | ICD-10-CM | POA: Insufficient documentation

## 2017-02-25 DIAGNOSIS — Z79899 Other long term (current) drug therapy: Secondary | ICD-10-CM | POA: Insufficient documentation

## 2017-02-25 DIAGNOSIS — R21 Rash and other nonspecific skin eruption: Secondary | ICD-10-CM

## 2017-02-25 DIAGNOSIS — F17210 Nicotine dependence, cigarettes, uncomplicated: Secondary | ICD-10-CM | POA: Insufficient documentation

## 2017-02-25 LAB — BASIC METABOLIC PANEL
Anion gap: 10 (ref 5–15)
BUN: 6 mg/dL (ref 6–20)
CALCIUM: 9 mg/dL (ref 8.9–10.3)
CO2: 22 mmol/L (ref 22–32)
CREATININE: 0.65 mg/dL (ref 0.44–1.00)
Chloride: 99 mmol/L — ABNORMAL LOW (ref 101–111)
GFR calc Af Amer: >60 mL/min (ref >60)
GLUCOSE: 412 mg/dL — AB (ref 65–99)
POTASSIUM: 3.6 mmol/L (ref 3.5–5.1)
SODIUM: 131 mmol/L — AB (ref 135–145)

## 2017-02-25 LAB — CBC
HCT: 37.6 % (ref 36.0–46.0)
Hemoglobin: 12.8 g/dL (ref 12.0–15.0)
MCH: 29 pg (ref 26.0–34.0)
MCHC: 34 g/dL (ref 30.0–36.0)
MCV: 85.1 fL (ref 78.0–100.0)
PLATELETS: 192 10*3/uL (ref 150–400)
RBC: 4.42 MIL/uL (ref 3.87–5.11)
RDW: 12.6 % (ref 11.5–15.5)
WBC: 6.4 10*3/uL (ref 4.0–10.5)

## 2017-02-25 LAB — CBG MONITORING, ED
GLUCOSE-CAPILLARY: 380 mg/dL — AB (ref 65–99)
Glucose-Capillary: 383 mg/dL — ABNORMAL HIGH (ref 65–99)

## 2017-02-25 MED ORDER — INSULIN GLARGINE 100 UNIT/ML ~~LOC~~ SOLN
10.0000 [IU] | Freq: Once | SUBCUTANEOUS | Status: AC
  Administered 2017-02-25: 10 [IU] via SUBCUTANEOUS
  Filled 2017-02-25: qty 0.1

## 2017-02-25 MED ORDER — HYDROCODONE-ACETAMINOPHEN 5-325 MG PO TABS
1.0000 | ORAL_TABLET | Freq: Once | ORAL | Status: AC
  Administered 2017-02-25: 1 via ORAL
  Filled 2017-02-25: qty 1

## 2017-02-25 MED ORDER — HYDROCODONE-ACETAMINOPHEN 5-325 MG PO TABS: 1.0000 | ORAL_TABLET | ORAL | 0 refills | Status: AC | PRN

## 2017-02-25 MED ORDER — NYSTATIN 100000 UNIT/GM EX POWD: Freq: Four times a day (QID) | CUTANEOUS | 0 refills | Status: DC

## 2017-02-25 NOTE — ED Notes (Signed)
Pt has a white yeast looking material on her vagina.

## 2017-02-25 NOTE — ED Notes (Signed)
PA at bedside.

## 2017-02-25 NOTE — ED Provider Notes (Signed)
[facility] Provider Note   CSN: [id number] Arrival date & time: 02/25/17  [id number]     History   Chief Complaint Chief Complaint  Patient presents with  . Rash    HPI  Shelley Baldwin is a 21 y.o. Female with a history of obesity and type 2 diabetes, presents complaining of painful burning rash in her perineal region after using Darene LamerNair 1 week ago.  She reports she has had burning and extremely painful rash for the past 3-4 days, which has been getting worse despite putting Vaseline over the rash.  Patient reports her skin has been taking down and is raw.  Reports she had similar symptoms in her armpits, but this rash has completely resolved.  Patient denies associated vaginal discharge or bleeding, and reports she did not use the nair internally.  Reports she had a similar rash several years ago under her breasts, and was told it was because her diabetes was poorly controlled.  Patient reports her sugars commonly run between 2 and 300, she does not check her sugar regularly at home.  Reports she has Lantus, but has not taken it at all in the past week.      Past Medical History:  Diagnosis Date  . Diabetes mellitus without complication (HCC)   . Nasal turbinate hypertrophy 07/2013    Patient Active Problem List   Diagnosis Date Noted  . Type 2 diabetes mellitus (HCC) 10/12/2015  . Elevated blood pressure 10/12/2015  . Morbid obesity (HCC) 10/12/2015  . IUD (intrauterine device) in place 10/12/2015  . Tobacco abuse 10/12/2015    Past Surgical History:  Procedure Laterality Date  . ADENOIDECTOMY    . BILATERAL TURBINATE REDUCTION Bilateral 08/05/2013   [facility]oh[facility]  . TURBINATE REDUCTION     x 3, per mother    OB History    Gravida Para Term Preterm AB Living   1             SAB TAB Ectopic Multiple Live Births                   Home Medications    Prior to Admission medications   Medication Sig Start  Date End Date Taking? Authorizing Provider  acetaminophen (TYLENOL) 500 MG tablet Take 1 tablet (500 mg total) by mouth every 6 (six) hours as needed. 12/21/16   Law, Waylan BogaAlexandra M, PA-C  albuterol (PROVENTIL HFA;VENTOLIN HFA) 108 (90 Base) MCG/ACT inhaler Inhale 1-2 puffs into the lungs every 6 (six) hours as needed for wheezing or shortness of breath.    [provider]  benzonatate (TESSALON) 100 MG capsule Take 1 capsule (100 mg total) by mouth every 8 (eight) hours. 12/21/16   Law, Waylan BogaAlexandra M, PA-C  glucose blood (ACCU-CHEK GUIDE) test strip Use as 3 times daily 01/18/16   Johna SheriffVincent, Carol L, MD  guaiFENesin (MUCINEX) 600 MG 12 hr tablet Take 1 tablet (600 mg total) by mouth 2 (two) times daily. Patient taking differently: Take 600 mg by mouth 2 (two) times daily as needed for cough or to loosen phlegm.  10/24/16 10/24/17  Diallo, Lilia ArgueAbdoulaye, MD  ibuprofen (ADVIL,MOTRIN) 800 MG tablet Take 1 tablet (800 mg total) by mouth 3 (three) times daily. 12/21/16   Law, Waylan BogaAlexandra M, PA-C  Insulin Glargine (LANTUS SOLOSTAR) 100 UNIT/ML Solostar Pen Inject 20 Units into the skin 2 (two) times daily. Patient taking differently: Inject 15 Units into the skin  2 (two) times daily.  01/25/16   Johna Sheriff, MD  Insulin Pen Needle (EXEL COMFORT POINT PEN NEEDLE) 31G X 4 MM MISC 1 application by Does not apply route daily. 11/02/15   Johna Sheriff, MD  Lancets (ACCU-CHEK MULTICLIX) lancets Use as instructed 09/19/15   Mechele Claude, MD  ondansetron (ZOFRAN) 4 MG tablet Take 1 tablet (4 mg total) by mouth every 6 (six) hours. 12/21/16   Law, Waylan Boga, PA-C  sitaGLIPtin-metformin (JANUMET) 50-1000 MG tablet Take 1 tablet by mouth 2 (two) times daily with a meal. 01/15/16   Dettinger, Elige Radon, MD    Family History Family History  Problem Relation Age of Onset  . Diabetes Mother     Social History Social History   Tobacco Use  . Smoking status: Current Every Day Smoker    Packs/day: 0.50    Years:  2.00    Pack years: 1.00    Types: Cigarettes  . Smokeless tobacco: Never Used  . Tobacco comment: 5-6 cig./day  Substance Use Topics  . Alcohol use: Yes    Comment: occasionally  . Drug use: No     Allergies   Milk-related compounds   Review of Systems Review of Systems  Constitutional: Negative for chills and fever.  HENT: Negative for congestion, rhinorrhea and sore throat.   Eyes: Negative for discharge and itching.  Respiratory: Negative for cough, chest tightness and shortness of breath.   Cardiovascular: Negative for chest pain.  Gastrointestinal: Negative for abdominal pain, nausea and vomiting.  Genitourinary: Negative for dysuria, vaginal bleeding and vaginal discharge.  Musculoskeletal: Negative for arthralgias.  Skin: Positive for rash.  Neurological: Negative for dizziness, weakness and numbness.     Physical Exam Updated Vital Signs There were no vitals taken for this visit.  Physical Exam  Constitutional: She appears well-developed and well-nourished. No distress.  HENT:  Head: Normocephalic and atraumatic.  Eyes: Right eye exhibits no discharge. Left eye exhibits no discharge.  Cardiovascular: Normal rate, regular rhythm and normal heart sounds.  Pulmonary/Chest: Effort normal and breath sounds normal. No stridor. No respiratory distress. She has no wheezes. She has no rales.  Abdominal: Soft. Bowel sounds are normal. She exhibits no distension. There is no tenderness.  Abdomen soft and nontender in all quadrants  Genitourinary:  Genitourinary Comments: Erythematous rash with some skin breakdown over mons pubis, labia and skin folds, rash worse in skin folds, rash does not appear to extend into vaginal mucosa, no drainage or discharge present.  Musculoskeletal: She exhibits no edema or deformity.  Neurological: She is alert. Coordination normal.  Skin: Skin is warm and dry. Capillary refill takes less than 2 seconds. Rash noted. She is not diaphoretic.    No rash noted in bilateral axillae   Psychiatric: She has a normal mood and affect. Her behavior is normal.  Nursing note and vitals reviewed.    ED Treatments / Results  Labs (all labs ordered are listed, but only abnormal results are displayed) Labs Reviewed  BASIC METABOLIC PANEL - Abnormal; Notable for the following components:      Result Value   Sodium 131 (*)    Chloride 99 (*)    Glucose, Bld 412 (*)    All other components within normal limits  CBG MONITORING, ED - Abnormal; Notable for the following components:   Glucose-Capillary 383 (*)    All other components within normal limits  CBG MONITORING, ED - Abnormal; Notable for the following components:   Glucose-Capillary  380 (*)    All other components within normal limits  CBC    EKG  EKG Interpretation None       Radiology No results found.  Procedures Procedures (including critical care time)  Medications Ordered in ED Medications  HYDROcodone-acetaminophen (NORCO/VICODIN) 5-325 MG per tablet 1 tablet (1 tablet Oral Given 02/25/17 1025)  insulin glargine (LANTUS) injection 10 Units (10 Units Subcutaneous Given 02/25/17 1202)     Initial Impression / Assessment and Plan / ED Course  I have reviewed the triage vital signs and the nursing notes.  Pertinent labs & imaging results that were available during my care of the patient were reviewed by me and considered in my medical decision making (see chart for details).  Patient presents with a perineal rash, with burning and itching, after using Darene LamerNair.  Pt also has poorly controlled diabetes, blood sugar today is 383.  Likely got a chemical burn from the hair removal product, and now has a yeast infection on top of it in this intertriginous region.  The area is very irritated with some areas of skin breakdown.  Will treat with nystatin powder, counseled patient on keeping area clean and dry, no more Vaseline.  Patient in a lot of pain from the rash, improved  with hydrocodone.  Was initially tachycardic, this improved with treatment of pain, vitals otherwise normal and patient is well-appearing. Lab evaluation shows no evidence of DKA, there is no anion gap or severe electrolyte derangement requiring intervention.  Patient treated with 10 units of Lantus here in the ED.  Stressed to the patient the importance of managing her diabetes, and the potential long-term consequences.  Patient provided follow-up with the [facility] for financial assistance with diabetes medications. Pt to follow-up if rash is not improving, return precautions provided, patient expresses understanding and is in agreement with plan.  Final Clinical Impressions(s) / ED Diagnoses   Final diagnoses:  Rash  Intertrigo  Hyperglycemia    ED Discharge Orders        Ordered    nystatin (MYCOSTATIN/NYSTOP) powder  4 times daily     02/25/17 1144    HYDROcodone-acetaminophen (NORCO/VICODIN) 5-325 MG tablet  Every 4 hours PRN     02/25/17 1144       Dartha LodgeFord, Madison Albea N, [place]PA-C 02/25/17 2120    Vanetta MuldersZackowski, Scott, MD 02/27/17 1747

## 2017-02-25 NOTE — ED Notes (Signed)
[facility]

## 2017-02-25 NOTE — ED Notes (Signed)
Pt hasn't taken by insulin (Lantus) in a week. When asked why she hasn't taken it, stated, "Uhm I just don't know."

## 2017-02-25 NOTE — ED Triage Notes (Signed)
Pt c/o irritation to perineal/groin area since using Nair x 7 days ago.

## 2017-02-25 NOTE — Discharge Instructions (Signed)
This rash is likely a combination of a reaction to the [place] Square[place]air and poorly controlled diabetes, please use nystatin powder 4 times daily, you may use ibuprofen for pain, and Norco for breakthrough pain.  Do not use Vaseline on the rash, keep area clean and dry.  If Rash is not improving you need to follow-up with the [facility] or your primary doctor.  It is very important that you keep your diabetes under control, otherwise this rash will not improve or may return.  Please call to schedule appointment for follow-up in the next 2-3 days.  You may also follow-up with the dermatologist provided below if rash is not improving.  Dr. Leticia ClasJohn H Hall, Dermatologist [address] [place], [place] [address] [phone]

## 2017-02-25 NOTE — ED Notes (Signed)
Have tried to clean the vaseline off of pt.'s vagina, but due to patient being in a lot of discomfort, I was not very successful. Upon wiping, pt's skin was sloughing off

## 2017-02-28 ENCOUNTER — Other Ambulatory Visit: Payer: Self-pay

## 2017-02-28 ENCOUNTER — Emergency Department (HOSPITAL_COMMUNITY)
Admission: EM | Admit: 2017-02-28 | Discharge: 2017-02-28 | Disposition: A | Discharge status: Home / Self Care | Payer: Self-pay | Attending: Emergency Medicine | Admitting: Emergency Medicine

## 2017-02-28 ENCOUNTER — Encounter (HOSPITAL_COMMUNITY): Payer: Self-pay | Admitting: *Deleted

## 2017-02-28 DIAGNOSIS — Z5321 Procedure and treatment not carried out due to patient leaving prior to being seen by health care provider: Secondary | ICD-10-CM | POA: Insufficient documentation

## 2017-02-28 DIAGNOSIS — N898 Other specified noninflammatory disorders of vagina: Secondary | ICD-10-CM | POA: Insufficient documentation

## 2017-02-28 LAB — BASIC METABOLIC PANEL
ANION GAP: 10 (ref 5–15)
BUN: <5 mg/dL — ABNORMAL LOW (ref 6–20)
CHLORIDE: 101 mmol/L (ref 101–111)
CO2: 21 mmol/L — AB (ref 22–32)
Calcium: 8.7 mg/dL — ABNORMAL LOW (ref 8.9–10.3)
Creatinine, Ser: 0.58 mg/dL (ref 0.44–1.00)
GFR calc Af Amer: >60 mL/min (ref >60)
GLUCOSE: 270 mg/dL — AB (ref 65–99)
POTASSIUM: 3.4 mmol/L — AB (ref 3.5–5.1)
Sodium: 132 mmol/L — ABNORMAL LOW (ref 135–145)

## 2017-02-28 LAB — URINALYSIS, ROUTINE W REFLEX MICROSCOPIC
BILIRUBIN URINE: NEGATIVE
KETONES UR: 80 mg/dL — AB
NITRITE: NEGATIVE
PH: 5 (ref 5.0–8.0)
PROTEIN: 30 mg/dL — AB
Specific Gravity, Urine: 1.037 — ABNORMAL HIGH (ref 1.005–1.030)

## 2017-02-28 LAB — CBG MONITORING, ED: Glucose-Capillary: 259 mg/dL — ABNORMAL HIGH (ref 65–99)

## 2017-02-28 LAB — CBC
HEMATOCRIT: 38.4 % (ref 36.0–46.0)
Hemoglobin: 13.2 g/dL (ref 12.0–15.0)
MCH: 29.5 pg (ref 26.0–34.0)
MCHC: 34.4 g/dL (ref 30.0–36.0)
MCV: 85.7 fL (ref 78.0–100.0)
PLATELETS: 216 10*3/uL (ref 150–400)
RBC: 4.48 MIL/uL (ref 3.87–5.11)
RDW: 13.2 % (ref 11.5–15.5)
WBC: 5.3 10*3/uL (ref 4.0–10.5)

## 2017-02-28 NOTE — ED Notes (Signed)
Called pt for vitals x2, no response. 

## 2017-02-28 NOTE — ED Notes (Signed)
Pt CBG was 259, notified Sabrina(RN)

## 2017-02-28 NOTE — ED Triage Notes (Signed)
2 Saturdays ago used Merrill Lynchair on vaginal area.  Then arm pits were burning.  Pt states was seen at [facility] on Saturday for the vaginal area.  She was sent home and told not to do vaseline. She was given a medicated powder and pain medication. Pt is diabetic and takes Lantus. Now pain, infected, and may have yeast infection over the area.

## 2017-03-03 ENCOUNTER — Encounter (HOSPITAL_COMMUNITY): Payer: Self-pay | Admitting: Emergency Medicine

## 2017-03-03 ENCOUNTER — Emergency Department (HOSPITAL_COMMUNITY)
Admission: EM | Admit: 2017-03-03 | Discharge: 2017-03-03 | Disposition: A | Discharge status: Home / Self Care | Payer: Self-pay | Attending: Emergency Medicine | Admitting: Emergency Medicine

## 2017-03-03 ENCOUNTER — Other Ambulatory Visit: Payer: Self-pay

## 2017-03-03 DIAGNOSIS — R21 Rash and other nonspecific skin eruption: Secondary | ICD-10-CM | POA: Insufficient documentation

## 2017-03-03 DIAGNOSIS — Z5321 Procedure and treatment not carried out due to patient leaving prior to being seen by health care provider: Secondary | ICD-10-CM | POA: Insufficient documentation

## 2017-03-03 LAB — CBG MONITORING, ED: Glucose-Capillary: 355 mg/dL — ABNORMAL HIGH (ref 65–99)

## 2017-03-03 NOTE — ED Notes (Signed)
Pt not seen in waiting area. Told RN in [place] that she would leave when the person she was visiting leaves.

## 2017-03-03 NOTE — ED Triage Notes (Signed)
Seen here for allergic rx, rash to genital area for using Darene LamerNair and yeast infection.  States has gotten worse and feels her yeast infection has spread and gotten worse. Redness noted to upper thighs. nad

## 2017-07-30 IMAGING — DX DG CHEST 2V
2 series · 2 of 2 positions shown · non-contrast
Comparison: 01/05/2016

CLINICAL DATA: Productive cough and lightheadedness for 1 week

EXAM:
CHEST  2 VIEW

[chest pa]
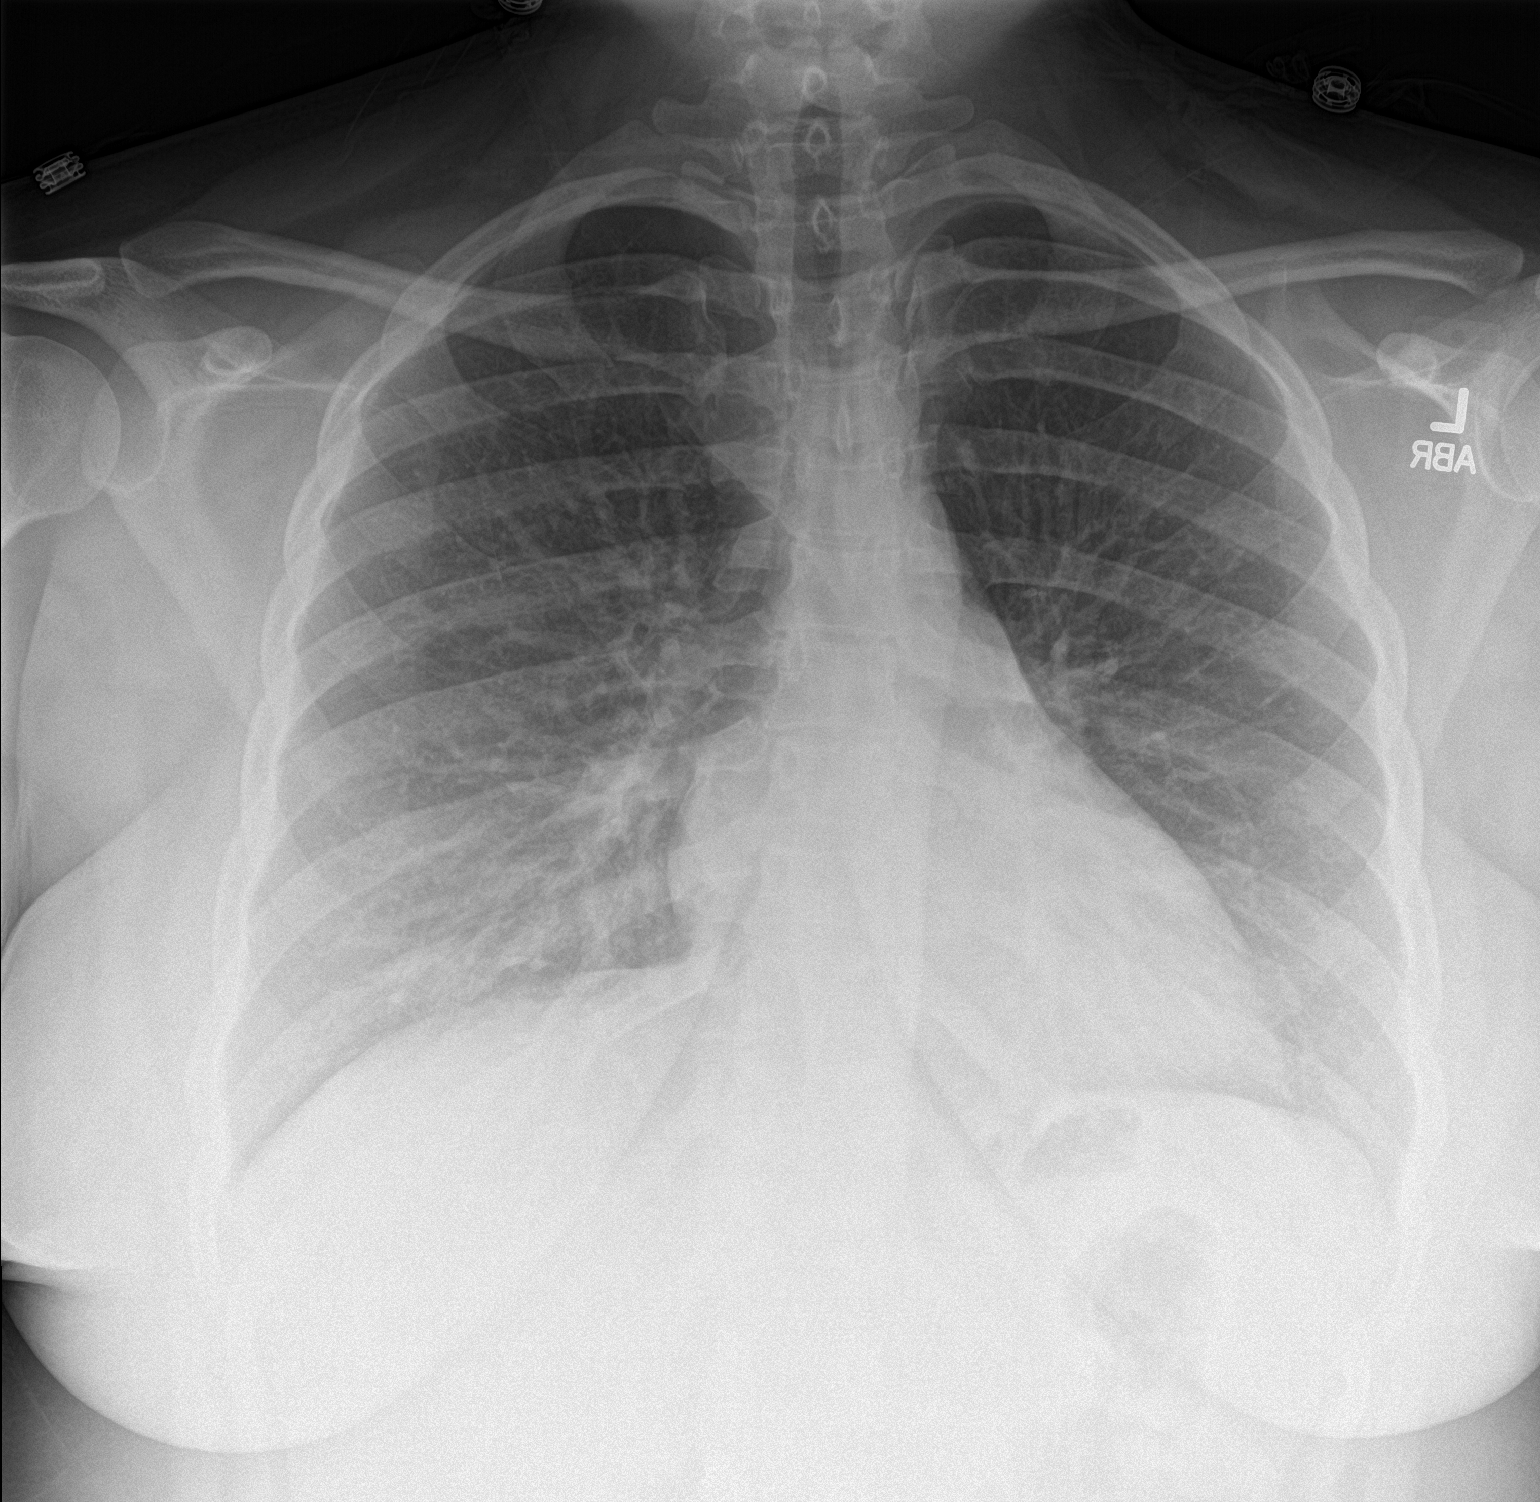

[chest lat]
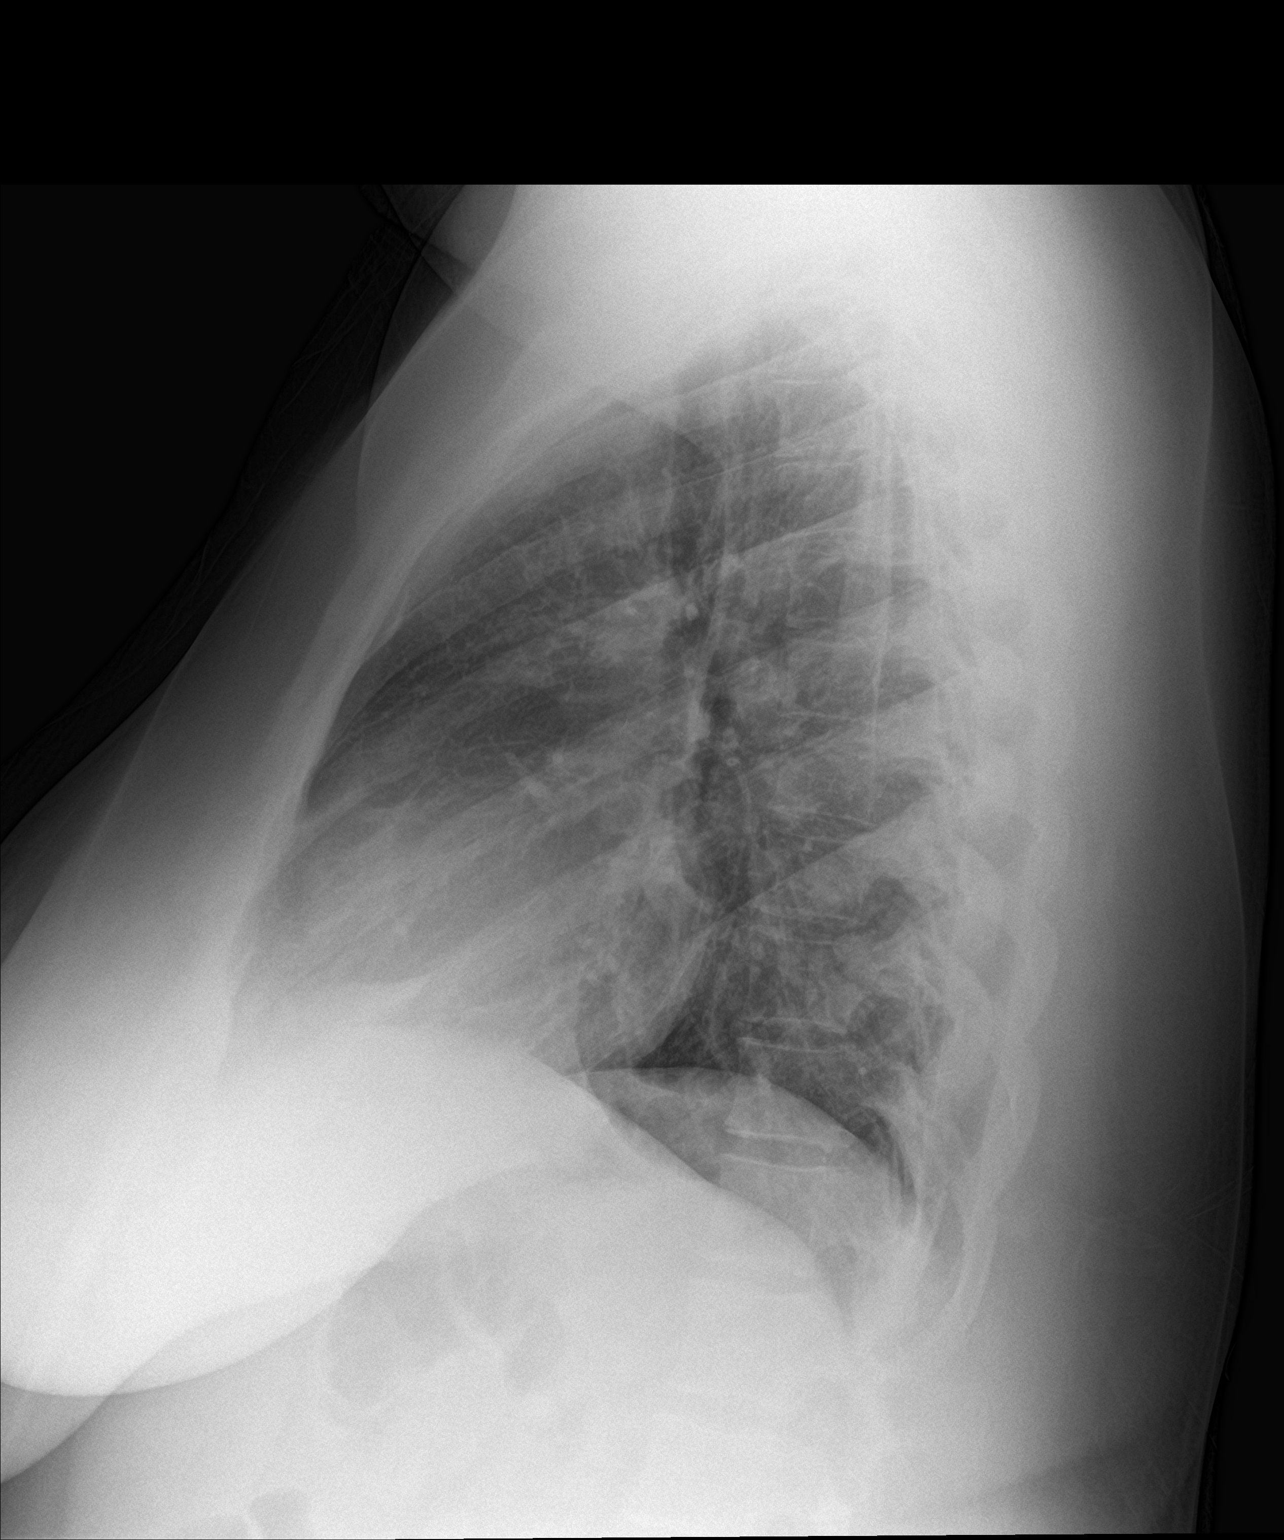

[2 of 2 positions shown; findings below may reference images not displayed]

FINDINGS: The heart size and mediastinal contours are within normal limits.
Both lungs are clear. The visualized skeletal structures are
unremarkable.
IMPRESSION: No active cardiopulmonary disease.

## 2017-08-15 ENCOUNTER — Emergency Department (HOSPITAL_COMMUNITY)
Admission: EM | Admit: 2017-08-15 | Discharge: 2017-08-15 | Disposition: A | Discharge status: Home / Self Care | Payer: Self-pay | Attending: Emergency Medicine | Admitting: Emergency Medicine

## 2017-08-15 ENCOUNTER — Other Ambulatory Visit: Payer: Self-pay

## 2017-08-15 ENCOUNTER — Encounter (HOSPITAL_COMMUNITY): Payer: Self-pay | Admitting: Emergency Medicine

## 2017-08-15 DIAGNOSIS — Y9389 Activity, other specified: Secondary | ICD-10-CM | POA: Insufficient documentation

## 2017-08-15 DIAGNOSIS — L089 Local infection of the skin and subcutaneous tissue, unspecified: Secondary | ICD-10-CM

## 2017-08-15 DIAGNOSIS — F17210 Nicotine dependence, cigarettes, uncomplicated: Secondary | ICD-10-CM | POA: Insufficient documentation

## 2017-08-15 DIAGNOSIS — W2209XA Striking against other stationary object, initial encounter: Secondary | ICD-10-CM | POA: Insufficient documentation

## 2017-08-15 DIAGNOSIS — Z794 Long term (current) use of insulin: Secondary | ICD-10-CM | POA: Insufficient documentation

## 2017-08-15 DIAGNOSIS — Y929 Unspecified place or not applicable: Secondary | ICD-10-CM | POA: Insufficient documentation

## 2017-08-15 DIAGNOSIS — Z23 Encounter for immunization: Secondary | ICD-10-CM | POA: Insufficient documentation

## 2017-08-15 DIAGNOSIS — E119 Type 2 diabetes mellitus without complications: Secondary | ICD-10-CM | POA: Insufficient documentation

## 2017-08-15 DIAGNOSIS — Z79899 Other long term (current) drug therapy: Secondary | ICD-10-CM | POA: Insufficient documentation

## 2017-08-15 DIAGNOSIS — L0889 Other specified local infections of the skin and subcutaneous tissue: Secondary | ICD-10-CM | POA: Insufficient documentation

## 2017-08-15 DIAGNOSIS — Y999 Unspecified external cause status: Secondary | ICD-10-CM | POA: Insufficient documentation

## 2017-08-15 DIAGNOSIS — S99922A Unspecified injury of left foot, initial encounter: Secondary | ICD-10-CM

## 2017-08-15 MED ORDER — DOXYCYCLINE HYCLATE 100 MG PO TABS
100.0000 mg | ORAL_TABLET | Freq: Once | ORAL | Status: AC
  Administered 2017-08-15: 100 mg via ORAL
  Filled 2017-08-15: qty 1

## 2017-08-15 MED ORDER — TETANUS-DIPHTH-ACELL PERTUSSIS 5-2.5-18.5 LF-MCG/0.5 IM SUSP
0.5000 mL | Freq: Once | INTRAMUSCULAR | Status: AC
  Administered 2017-08-15: 0.5 mL via INTRAMUSCULAR
  Filled 2017-08-15: qty 0.5

## 2017-08-15 MED ORDER — CEPHALEXIN 500 MG PO CAPS: 500.0000 mg | ORAL_CAPSULE | Freq: Four times a day (QID) | ORAL | 0 refills | Status: DC

## 2017-08-15 NOTE — ED Provider Notes (Signed)
[facility] EMERGENCY DEPARTMENT Provider Note   CSN: [id number] Arrival date & time: 08/15/17  1759     History   Chief Complaint Chief Complaint  Patient presents with  . Foot Injury    HPI Shelley Baldwin is a 22 y.o. female.  Patient is a 22 year old female who presents to the emergency department with a complaint of an infected laceration of the foot.  The patient states that 2 weeks ago she cut her foot on an object outside while she was taking out some trash.  She states she fell over part of the trash can.  She sustained a laceration to the top of the left foot.  She says she has been working with it with Neosporin, but after 2 weeks there still seems to be an area that is not completely closing, and it oozes from time to time.  She is noticed some redness around the area.  She is concerned because she is a type II diabetic.  She would like to have it evaluated and also seek antibiotics if needed.  The patient states she is not sure of the date of her last tetanus.  The history is provided by the patient.  Foot Injury      Past Medical History:  Diagnosis Date  . Diabetes mellitus without complication (HCC)   . Nasal turbinate hypertrophy 07/2013    Patient Active Problem List   Diagnosis Date Noted  . Type 2 diabetes mellitus (HCC) 10/12/2015  . Elevated blood pressure 10/12/2015  . Morbid obesity (HCC) 10/12/2015  . IUD (intrauterine device) in place 10/12/2015  . Tobacco abuse 10/12/2015    Past Surgical History:  Procedure Laterality Date  . ADENOIDECTOMY    . TURBINATE REDUCTION     x 3, per mother  . TURBINATE REDUCTION Bilateral 08/05/2013   Procedure: BILATERAL TURBINATE REDUCTION;  Surgeon: Darletta Moll, MD;  Location: [facility];  Service: ENT;  Laterality: Bilateral;     OB History    Gravida  1   Para      Term      Preterm      AB      Living        SAB      TAB      Ectopic      Multiple      Live Births                 Home Medications    Prior to Admission medications   Medication Sig Start Date End Date Taking? Authorizing Provider  acetaminophen (TYLENOL) 500 MG tablet Take 1 tablet (500 mg total) by mouth every 6 (six) hours as needed. 12/21/16   Law, Waylan Boga, PA-C  albuterol (PROVENTIL HFA;VENTOLIN HFA) 108 (90 Base) MCG/ACT inhaler Inhale 1-2 puffs into the lungs every 6 (six) hours as needed for wheezing or shortness of breath.    [provider]  benzonatate (TESSALON) 100 MG capsule Take 1 capsule (100 mg total) by mouth every 8 (eight) hours. 12/21/16   Law, Waylan Boga, PA-C  cephALEXin (KEFLEX) 500 MG capsule Take 1 capsule (500 mg total) by mouth 4 (four) times daily. 08/15/17   Ivery Quale, PA-C  glucose blood (ACCU-CHEK GUIDE) test strip Use as 3 times daily 01/18/16   Johna Sheriff, MD  guaiFENesin (MUCINEX) 600 MG 12 hr tablet Take 1 tablet (600 mg total) by mouth 2 (two) times daily. Patient taking differently: Take 600 mg  by mouth 2 (two) times daily as needed for cough or to loosen phlegm.  10/24/16 10/24/17  Diallo, Lilia Argue, MD  ibuprofen (ADVIL,MOTRIN) 800 MG tablet Take 1 tablet (800 mg total) by mouth 3 (three) times daily. 12/21/16   Law, Waylan Boga, PA-C  Insulin Glargine (LANTUS SOLOSTAR) 100 UNIT/ML Solostar Pen Inject 20 Units into the skin 2 (two) times daily. Patient taking differently: Inject 15 Units into the skin 2 (two) times daily.  01/25/16   Johna Sheriff, MD  Insulin Pen Needle (EXEL COMFORT POINT PEN NEEDLE) 31G X 4 MM MISC 1 application by Does not apply route daily. 11/02/15   Johna Sheriff, MD  Lancets (ACCU-CHEK MULTICLIX) lancets Use as instructed 09/19/15   Mechele Claude, MD  nystatin (MYCOSTATIN/NYSTOP) powder Apply 4 (four) times daily topically. 02/25/17   Dartha Lodge, PA-C  ondansetron (ZOFRAN) 4 MG tablet Take 1 tablet (4 mg total) by mouth every 6 (six) hours. 12/21/16   Law, Waylan Boga, PA-C  sitaGLIPtin-metformin (JANUMET)  50-1000 MG tablet Take 1 tablet by mouth 2 (two) times daily with a meal. 01/15/16   Dettinger, Elige Radon, MD    Family History Family History  Problem Relation Age of Onset  . Diabetes Mother     Social History Social History   Tobacco Use  . Smoking status: Current Every Day Smoker    Packs/day: 0.50    Years: 2.00    Pack years: 1.00    Types: Cigarettes  . Smokeless tobacco: Never Used  . Tobacco comment: 5-6 cig./day  Substance Use Topics  . Alcohol use: Yes    Comment: occasionally  . Drug use: No     Allergies   Milk-related compounds   Review of Systems Review of Systems  Skin: Positive for wound.       Laceration of the left foot     Physical Exam Updated Vital Signs BP 140/82 (BP Location: Right Arm)   Pulse 89   Temp 98.4 F (36.9 C) (Oral)   Resp 15   SpO2 100%   Physical Exam  Constitutional: She is oriented to person, place, and time. She appears well-developed and well-nourished.  Non-toxic appearance.  HENT:  Head: Normocephalic.  Right Ear: Tympanic membrane and external ear normal.  Left Ear: Tympanic membrane and external ear normal.  Eyes: Pupils are equal, round, and reactive to light. EOM and lids are normal.  Neck: Normal range of motion. Neck supple. Carotid bruit is not present.  Cardiovascular: Normal rate, regular rhythm, normal heart sounds, intact distal pulses and normal pulses.  Pulmonary/Chest: Breath sounds normal. No respiratory distress.  Abdominal: Soft. Bowel sounds are normal. There is no tenderness. There is no guarding.  Musculoskeletal: Normal range of motion.       Feet:  Patient has a healing laceration of the dorsum of the left foot near the first MP joint.  There is increased redness around the area.  There is minimal soreness with flexion and extension of the great toe on the left.  There is no red streaking appreciated.  The dorsalis pedis and posterior tibial pulses are 2+.  There are no lesions between the  toes.  There are no puncture wounds of the plantar surface.  Lymphadenopathy:       Head (right side): No submandibular adenopathy present.       Head (left side): No submandibular adenopathy present.    She has no cervical adenopathy.  Neurological: She is alert and oriented to  person, place, and time. She has normal strength. No cranial nerve deficit or sensory deficit.  Skin: Skin is warm and dry.  Psychiatric: She has a normal mood and affect. Her speech is normal.  Nursing note and vitals reviewed.    ED Treatments / Results  Labs (all labs ordered are listed, but only abnormal results are displayed) Labs Reviewed - No data to display  EKG None  Radiology No results found.  Procedures Procedures (including critical care time)  Medications Ordered in ED Medications  doxycycline (VIBRA-TABS) tablet 100 mg (100 mg Oral Given 08/15/17 1856)  Tdap (BOOSTRIX) injection 0.5 mL (0.5 mLs Intramuscular Given 08/15/17 1858)     Initial Impression / Assessment and Plan / ED Course  I have reviewed the triage vital signs and the nursing notes.  Pertinent labs & imaging results that were available during my care of the patient were reviewed by me and considered in my medical decision making (see chart for details).      Final Clinical Impressions(s) / ED Diagnoses MDM  Vital signs within normal limits.  Pulse oximetry is 100% on room air.  The patient has a healing laceration of the left foot.  Patient is a type II diabetic.  The patient confesses that she is not been monitoring her diabetes the way she should because she is not had health insurance, and has not seen a doctor recently.  She is also not compliant with her medications.  She became concerned about infection.  No high fever noted.  No red streaking appreciated of the left foot.  I have asked patient to use warm tub soaks, and to apply bandage to the area until it has thoroughly healed.  I have also prescribed Keflex for  the patient.  I have provided information on the [facility] clinic, the [facility], and the [facility] clinic.  Patient is in agreement with these plans.   Final diagnoses:  Injury of left foot, initial encounter  Skin infection    ED Discharge Orders        Ordered    cephALEXin (KEFLEX) 500 MG capsule  4 times daily     08/15/17 1853       Ivery Quale, PA-C 08/15/17 1920    Mesner, Barbara Cower, MD 08/15/17 2215

## 2017-08-15 NOTE — ED Triage Notes (Signed)
Fell over trash can last night, cut to left foot, pt is DM type 2 , concerned with infection.

## 2017-08-15 NOTE — Discharge Instructions (Addendum)
Please soak the affected area on your left foot in warm Epson salt water for about 10 to 15 minutes daily.  Please pat dry thoroughly.  Apply a Band-Aid to the area until the wound has healed.  Please use keflex with each meal and at bedtime.  Please see the providers at the [facility] clinic here in the [place] area, or the providers at the [facility] in the evening, or the [facility] in [place] to assist you with your diabetes control.  Please return to the emergency department if any signs of advancing infection.

## 2018-01-02 ENCOUNTER — Emergency Department (HOSPITAL_COMMUNITY)
Admission: EM | Admit: 2018-01-02 | Discharge: 2018-01-02 | Disposition: A | Discharge status: Home / Self Care | Payer: Self-pay | Attending: Emergency Medicine | Admitting: Emergency Medicine

## 2018-01-02 ENCOUNTER — Encounter (HOSPITAL_COMMUNITY): Payer: Self-pay

## 2018-01-02 ENCOUNTER — Other Ambulatory Visit: Payer: Self-pay

## 2018-01-02 ENCOUNTER — Emergency Department (HOSPITAL_COMMUNITY): Payer: Self-pay

## 2018-01-02 DIAGNOSIS — Z794 Long term (current) use of insulin: Secondary | ICD-10-CM | POA: Insufficient documentation

## 2018-01-02 DIAGNOSIS — F17210 Nicotine dependence, cigarettes, uncomplicated: Secondary | ICD-10-CM | POA: Insufficient documentation

## 2018-01-02 DIAGNOSIS — E876 Hypokalemia: Secondary | ICD-10-CM | POA: Insufficient documentation

## 2018-01-02 DIAGNOSIS — B349 Viral infection, unspecified: Secondary | ICD-10-CM | POA: Insufficient documentation

## 2018-01-02 DIAGNOSIS — E119 Type 2 diabetes mellitus without complications: Secondary | ICD-10-CM | POA: Insufficient documentation

## 2018-01-02 LAB — CBC WITH DIFFERENTIAL/PLATELET
Basophils Absolute: 0 10*3/uL (ref 0.0–0.1)
Basophils Relative: 0 %
EOS PCT: 0 %
Eosinophils Absolute: 0 10*3/uL (ref 0.0–0.7)
HEMATOCRIT: 39.2 % (ref 36.0–46.0)
Hemoglobin: 13.3 g/dL (ref 12.0–15.0)
LYMPHS PCT: 18 %
Lymphs Abs: 1 10*3/uL (ref 0.7–4.0)
MCH: 30.4 pg (ref 26.0–34.0)
MCHC: 33.9 g/dL (ref 30.0–36.0)
MCV: 89.7 fL (ref 78.0–100.0)
MONO ABS: 0.2 10*3/uL (ref 0.1–1.0)
MONOS PCT: 4 %
NEUTROS ABS: 4.2 10*3/uL (ref 1.7–7.7)
Neutrophils Relative %: 78 %
PLATELETS: 155 10*3/uL (ref 150–400)
RBC: 4.37 MIL/uL (ref 3.87–5.11)
RDW: 13 % (ref 11.5–15.5)
WBC: 5.4 10*3/uL (ref 4.0–10.5)

## 2018-01-02 LAB — COMPREHENSIVE METABOLIC PANEL
ALK PHOS: 59 U/L (ref 38–126)
ALT: 58 U/L — ABNORMAL HIGH (ref 0–44)
ANION GAP: 8 (ref 5–15)
AST: 131 U/L — ABNORMAL HIGH (ref 15–41)
Albumin: 2.9 g/dL — ABNORMAL LOW (ref 3.5–5.0)
BUN: 5 mg/dL — ABNORMAL LOW (ref 6–20)
CALCIUM: 7.9 mg/dL — AB (ref 8.9–10.3)
CO2: 21 mmol/L — ABNORMAL LOW (ref 22–32)
Chloride: 102 mmol/L (ref 98–111)
Creatinine, Ser: 0.63 mg/dL (ref 0.44–1.00)
GFR calc Af Amer: >60 mL/min (ref >60)
GLUCOSE: 109 mg/dL — AB (ref 70–99)
POTASSIUM: 2.8 mmol/L — AB (ref 3.5–5.1)
Sodium: 131 mmol/L — ABNORMAL LOW (ref 135–145)
Total Bilirubin: 0.9 mg/dL (ref 0.3–1.2)
Total Protein: 7.4 g/dL (ref 6.5–8.1)

## 2018-01-02 LAB — URINALYSIS, ROUTINE W REFLEX MICROSCOPIC
Bilirubin Urine: NEGATIVE
GLUCOSE, UA: NEGATIVE mg/dL
Hgb urine dipstick: NEGATIVE
Ketones, ur: 80 mg/dL — AB
Nitrite: NEGATIVE
PROTEIN: 30 mg/dL — AB
Specific Gravity, Urine: 1.024 (ref 1.005–1.030)
pH: 6 (ref 5.0–8.0)

## 2018-01-02 LAB — POC URINE PREG, ED: Preg Test, Ur: NEGATIVE

## 2018-01-02 LAB — INFLUENZA PANEL BY PCR (TYPE A & B)
INFLBPCR: NEGATIVE
Influenza A By PCR: NEGATIVE

## 2018-01-02 LAB — GROUP A STREP BY PCR: Group A Strep by PCR: NOT DETECTED

## 2018-01-02 LAB — LACTIC ACID, PLASMA: Lactic Acid, Venous: 1 mmol/L (ref 0.5–1.9)

## 2018-01-02 MED ORDER — ACETAMINOPHEN 325 MG PO TABS
650.0000 mg | ORAL_TABLET | Freq: Once | ORAL | Status: AC | PRN
  Administered 2018-01-02: 650 mg via ORAL
  Filled 2018-01-02: qty 2

## 2018-01-02 MED ORDER — IBUPROFEN 800 MG PO TABS
800.0000 mg | ORAL_TABLET | Freq: Once | ORAL | Status: AC
  Administered 2018-01-02: 800 mg via ORAL
  Filled 2018-01-02: qty 1

## 2018-01-02 MED ORDER — SODIUM CHLORIDE 0.9 % IV BOLUS
1000.0000 mL | Freq: Once | INTRAVENOUS | Status: AC
  Administered 2018-01-02: 1000 mL via INTRAVENOUS

## 2018-01-02 MED ORDER — POTASSIUM CHLORIDE CRYS ER 20 MEQ PO TBCR: 20.0000 meq | EXTENDED_RELEASE_TABLET | Freq: Two times a day (BID) | ORAL | 0 refills | Status: DC

## 2018-01-02 MED ORDER — POTASSIUM CHLORIDE 10 MEQ/100ML IV SOLN
10.0000 meq | Freq: Once | INTRAVENOUS | Status: AC
  Administered 2018-01-02: 10 meq via INTRAVENOUS
  Filled 2018-01-02: qty 100

## 2018-01-02 MED ORDER — POTASSIUM CHLORIDE CRYS ER 20 MEQ PO TBCR
20.0000 meq | EXTENDED_RELEASE_TABLET | Freq: Once | ORAL | Status: AC
  Administered 2018-01-02: 20 meq via ORAL
  Filled 2018-01-02: qty 1

## 2018-01-02 MED ORDER — ONDANSETRON HCL 4 MG/2ML IJ SOLN
4.0000 mg | Freq: Once | INTRAMUSCULAR | Status: AC
  Administered 2018-01-02: 4 mg via INTRAVENOUS
  Filled 2018-01-02: qty 2

## 2018-01-02 NOTE — Discharge Instructions (Signed)
Rest,  Drink plenty of fluids.  Take motrin or tylenol for body aches, headache and fever reduction.  Get rechecked for increased shortness of breath,  Increased fever or increasing weakness.  Your labs and chest xray are ok tonight, except for your low potassium which has been partially replaced, but you should continue to take the supplement prescribed for the next week.

## 2018-01-02 NOTE — ED Provider Notes (Signed)
[facility] EMERGENCY DEPARTMENT Provider Note   CSN: [id number] Arrival date & time: 01/02/18  1802     History   Chief Complaint Chief Complaint  Patient presents with  . Generalized Body Aches  . Sore Throat    HPI Shelley Baldwin is a 22 y.o. female with a history of diabetes and hypertension presenting with a 2-day history of flulike symptoms.  She describes developing generalized body aches weakness and fatigue 2 days ago.  She she gradually also developed a sore throat along with a generalized headache, she has been coughing also with production of scant green sputum, denies wheezing or shortness of breath.  Pertinent negatives include no chest pain, abdominal pain, she does have nausea with one episode of vomiting early this morning however.  No diarrhea.  She denies dysuria, back pain, pelvic pain, vaginal discharge.  She has taken Motrin for her symptoms, last dose taken yesterday evening.  She denies any recent travel, no known exposures to others with similar symptoms.  The history is provided by the patient.    Past Medical History:  Diagnosis Date  . Diabetes mellitus without complication (HCC)   . Nasal turbinate hypertrophy 07/2013    Patient Active Problem List   Diagnosis Date Noted  . Type 2 diabetes mellitus (HCC) 10/12/2015  . Elevated blood pressure 10/12/2015  . Morbid obesity (HCC) 10/12/2015  . IUD (intrauterine device) in place 10/12/2015  . Tobacco abuse 10/12/2015    Past Surgical History:  Procedure Laterality Date  . ADENOIDECTOMY    . TURBINATE REDUCTION     x 3, per mother  . TURBINATE REDUCTION Bilateral 08/05/2013   Procedure: BILATERAL TURBINATE REDUCTION;  Surgeon: Darletta Moll, MD;  Location: [facility];  Service: ENT;  Laterality: Bilateral;     OB History    Gravida  1   Para      Term      Preterm      AB      Living        SAB      TAB      Ectopic      Multiple      Live Births                Home Medications    Prior to Admission medications   Medication Sig Start Date End Date Taking? Authorizing Provider  acetaminophen (TYLENOL) 500 MG tablet Take 1 tablet (500 mg total) by mouth every 6 (six) hours as needed. 12/21/16   Law, Waylan Boga, PA-C  albuterol (PROVENTIL HFA;VENTOLIN HFA) 108 (90 Base) MCG/ACT inhaler Inhale 1-2 puffs into the lungs every 6 (six) hours as needed for wheezing or shortness of breath.    [provider]  benzonatate (TESSALON) 100 MG capsule Take 1 capsule (100 mg total) by mouth every 8 (eight) hours. 12/21/16   Law, Waylan Boga, PA-C  cephALEXin (KEFLEX) 500 MG capsule Take 1 capsule (500 mg total) by mouth 4 (four) times daily. 08/15/17   Ivery Quale, PA-C  glucose blood (ACCU-CHEK GUIDE) test strip Use as 3 times daily 01/18/16   Johna Sheriff, MD  ibuprofen (ADVIL,MOTRIN) 800 MG tablet Take 1 tablet (800 mg total) by mouth 3 (three) times daily. 12/21/16   Law, Waylan Boga, PA-C  Insulin Glargine (LANTUS SOLOSTAR) 100 UNIT/ML Solostar Pen Inject 20 Units into the skin 2 (two) times daily. Patient taking differently: Inject 15 Units into the skin 2 (two) times daily.  01/25/16   Johna SheriffVincent, Carol L, MD  Insulin Pen Needle (EXEL COMFORT POINT PEN NEEDLE) 31G X 4 MM MISC 1 application by Does not apply route daily. 11/02/15   Johna SheriffVincent, Carol L, MD  Lancets (ACCU-CHEK MULTICLIX) lancets Use as instructed 09/19/15   Mechele ClaudeStacks, Warren, MD  nystatin (MYCOSTATIN/NYSTOP) powder Apply 4 (four) times daily topically. 02/25/17   Dartha LodgeFord, Kelsey N, PA-C  ondansetron (ZOFRAN) 4 MG tablet Take 1 tablet (4 mg total) by mouth every 6 (six) hours. 12/21/16   Law, Waylan BogaAlexandra M, PA-C  potassium chloride SA (K-DUR,KLOR-CON) 20 MEQ tablet Take 1 tablet (20 mEq total) by mouth 2 (two) times daily. 01/02/18   Burgess AmorIdol, Shamir Sedlar, PA-C  sitaGLIPtin-metformin (JANUMET) 50-1000 MG tablet Take 1 tablet by mouth 2 (two) times daily with a meal. 01/15/16   Dettinger, Elige RadonJoshua A, MD     Family History Family History  Problem Relation Age of Onset  . Diabetes Mother     Social History Social History   Tobacco Use  . Smoking status: Current Every Day Smoker    Packs/day: 0.50    Years: 2.00    Pack years: 1.00    Types: Cigarettes  . Smokeless tobacco: Never Used  . Tobacco comment: 5-6 cig./day  Substance Use Topics  . Alcohol use: Yes    Comment: occasionally  . Drug use: No     Allergies   Milk-related compounds   Review of Systems Review of Systems  Constitutional: Positive for chills and fever.  HENT: Positive for sore throat. Negative for congestion, ear pain, rhinorrhea, sinus pressure, trouble swallowing and voice change.   Eyes: Negative for discharge.  Respiratory: Positive for cough. Negative for shortness of breath, wheezing and stridor.   Cardiovascular: Negative for chest pain.  Gastrointestinal: Positive for nausea and vomiting. Negative for abdominal pain.  Genitourinary: Negative.   Neurological: Positive for headaches.     Physical Exam Updated Vital Signs BP (!) 114/57 (BP Location: Left Arm)   Pulse 100   Temp 99.9 F (37.7 C) (Oral)   Resp 20   Ht 5\' 7"  (1.702 m)   Wt 131.5 kg   LMP 12/27/2017   SpO2 96%   BMI 45.42 kg/m   Physical Exam  Constitutional: She is oriented to person, place, and time. She appears well-developed and well-nourished.  Febrile at presentation.  HENT:  Head: Normocephalic and atraumatic.  Right Ear: Tympanic membrane and ear canal normal.  Left Ear: Tympanic membrane and ear canal normal.  Nose: Mucosal edema and rhinorrhea present.  Mouth/Throat: Uvula is midline. Mucous membranes are dry. Posterior oropharyngeal erythema present. No oropharyngeal exudate, posterior oropharyngeal edema or tonsillar abscesses.  Eyes: Conjunctivae are normal.  Neck: Normal range of motion. Neck supple.  Cardiovascular: Normal heart sounds and intact distal pulses. Tachycardia present.  Pulmonary/Chest:  Effort normal. No respiratory distress. She has no wheezes. She has no rales.  Abdominal: Soft. She exhibits no distension and no mass. There is no tenderness. There is no guarding.  Musculoskeletal: Normal range of motion.  Lymphadenopathy:    She has no cervical adenopathy.  Neurological: She is alert and oriented to person, place, and time.  Skin: Skin is warm and dry. No rash noted.  Psychiatric: She has a normal mood and affect.  Vitals reviewed.    ED Treatments / Results  Labs (all labs ordered are listed, but only abnormal results are displayed) Labs Reviewed  COMPREHENSIVE METABOLIC PANEL - Abnormal; Notable for the following components:  Result Value   Sodium 131 (*)    Potassium 2.8 (*)    CO2 21 (*)    Glucose, Bld 109 (*)    BUN 5 (*)    Calcium 7.9 (*)    Albumin 2.9 (*)    AST 131 (*)    ALT 58 (*)    All other components within normal limits  URINALYSIS, ROUTINE W REFLEX MICROSCOPIC - Abnormal; Notable for the following components:   Color, Urine AMBER (*)    APPearance HAZY (*)    Ketones, ur 80 (*)    Protein, ur 30 (*)    Leukocytes, UA TRACE (*)    Bacteria, UA RARE (*)    All other components within normal limits  GROUP A STREP BY PCR  CBC WITH DIFFERENTIAL/PLATELET  LACTIC ACID, PLASMA  INFLUENZA PANEL BY PCR (TYPE A & B)  POC URINE PREG, ED    EKG None  Radiology Dg Chest 2 View  Result Date: 01/02/2018 CLINICAL DATA:  Cough, fever. EXAM: CHEST - 2 VIEW COMPARISON:  Radiographs of February 13, 2017. FINDINGS: The heart size and mediastinal contours are within normal limits. Both lungs are clear. No pneumothorax or pleural effusion is noted. The visualized skeletal structures are unremarkable. IMPRESSION: No active cardiopulmonary disease. Electronically Signed   By: Lupita Raider, M.D.   On: 01/02/2018 20:17    Procedures Procedures (including critical care time)  Medications Ordered in ED Medications  acetaminophen (TYLENOL) tablet  650 mg (650 mg Oral Given 01/02/18 1820)  sodium chloride 0.9 % bolus 1,000 mL (0 mLs Intravenous Stopped 01/02/18 2021)  ondansetron (ZOFRAN) injection 4 mg (4 mg Intravenous Given 01/02/18 1853)  potassium chloride 10 mEq in 100 mL IVPB (0 mEq Intravenous Stopped 01/02/18 2201)  potassium chloride SA (K-DUR,KLOR-CON) CR tablet 20 mEq (20 mEq Oral Given 01/02/18 2046)  sodium chloride 0.9 % bolus 1,000 mL (0 mLs Intravenous Stopped 01/02/18 2201)  ibuprofen (ADVIL,MOTRIN) tablet 800 mg (800 mg Oral Given 01/02/18 2249)     Initial Impression / Assessment and Plan / ED Course  I have reviewed the triage vital signs and the nursing notes.  Pertinent labs & imaging results that were available during my care of the patient were reviewed by me and considered in my medical decision making (see chart for details).     Pt with flu like symptoms, but with negative influenza panel.  She was significantly dehydrated with hypokalemia.  She was rehydrated with potassium replaced with both oral and IV.  Fever reduced with tylenol.  Headache persisted so added ibuprofen.  Pt felt improved at time of dc.  She tolerated PO intake.  Advised rest, increased fluid intake, motrin/tylenol for fever and headache.  F/u with pcp or return here for worsened sx.   Final Clinical Impressions(s) / ED Diagnoses   Final diagnoses:  Viral syndrome  Hypokalemia    ED Discharge Orders         Ordered    potassium chloride SA (K-DUR,KLOR-CON) 20 MEQ tablet  2 times daily     01/02/18 2235           Burgess Amor, PA-C 01/03/18 0124    Terrilee Files, MD 01/03/18 1224

## 2018-01-02 NOTE — ED Triage Notes (Signed)
Pt reports generalized aches, weakness, on Sunday. Pt reports she has a sore throat and HA. Pt has been taking motrin

## 2018-02-16 ENCOUNTER — Ambulatory Visit (HOSPITAL_COMMUNITY)
Admission: RE | Admit: 2018-02-16 | Discharge: 2018-02-16 | Disposition: A | Discharge status: Home / Self Care | Payer: Federal, State, Local not specified - Other | Attending: Psychiatry | Admitting: Psychiatry

## 2018-02-16 ENCOUNTER — Encounter (HOSPITAL_COMMUNITY): Payer: Self-pay

## 2018-02-16 ENCOUNTER — Other Ambulatory Visit: Payer: Self-pay

## 2018-02-16 ENCOUNTER — Emergency Department (HOSPITAL_COMMUNITY)
Admission: EM | Admit: 2018-02-16 | Discharge: 2018-02-17 | Disposition: A | Discharge status: Other Acute Inpatient Hospital | Payer: Self-pay | Attending: Emergency Medicine | Admitting: Emergency Medicine

## 2018-02-16 DIAGNOSIS — F1020 Alcohol dependence, uncomplicated: Secondary | ICD-10-CM | POA: Insufficient documentation

## 2018-02-16 DIAGNOSIS — F332 Major depressive disorder, recurrent severe without psychotic features: Secondary | ICD-10-CM | POA: Diagnosis present

## 2018-02-16 DIAGNOSIS — E119 Type 2 diabetes mellitus without complications: Secondary | ICD-10-CM | POA: Insufficient documentation

## 2018-02-16 DIAGNOSIS — F322 Major depressive disorder, single episode, severe without psychotic features: Secondary | ICD-10-CM | POA: Insufficient documentation

## 2018-02-16 DIAGNOSIS — R45851 Suicidal ideations: Secondary | ICD-10-CM | POA: Insufficient documentation

## 2018-02-16 DIAGNOSIS — F17210 Nicotine dependence, cigarettes, uncomplicated: Secondary | ICD-10-CM | POA: Diagnosis not present

## 2018-02-16 DIAGNOSIS — Z794 Long term (current) use of insulin: Secondary | ICD-10-CM | POA: Insufficient documentation

## 2018-02-16 LAB — CBC
HEMATOCRIT: 41.5 % (ref 36.0–46.0)
Hemoglobin: 13.6 g/dL (ref 12.0–15.0)
MCH: 30.5 pg (ref 26.0–34.0)
MCHC: 32.8 g/dL (ref 30.0–36.0)
MCV: 93 fL (ref 80.0–100.0)
NRBC: 0 % (ref 0.0–0.2)
Platelets: 240 10*3/uL (ref 150–400)
RBC: 4.46 MIL/uL (ref 3.87–5.11)
RDW: 13.6 % (ref 11.5–15.5)
WBC: 6.4 10*3/uL (ref 4.0–10.5)

## 2018-02-16 LAB — COMPREHENSIVE METABOLIC PANEL
ALK PHOS: 68 U/L (ref 38–126)
ALT: 20 U/L (ref 0–44)
AST: 23 U/L (ref 15–41)
Albumin: 3.7 g/dL (ref 3.5–5.0)
Anion gap: 9 (ref 5–15)
BUN: 8 mg/dL (ref 6–20)
CHLORIDE: 104 mmol/L (ref 98–111)
CO2: 26 mmol/L (ref 22–32)
CREATININE: 0.54 mg/dL (ref 0.44–1.00)
Calcium: 9.2 mg/dL (ref 8.9–10.3)
GFR calc Af Amer: >60 mL/min (ref >60)
Glucose, Bld: 84 mg/dL (ref 70–99)
Potassium: 3.6 mmol/L (ref 3.5–5.1)
SODIUM: 139 mmol/L (ref 135–145)
Total Bilirubin: 0.6 mg/dL (ref 0.3–1.2)
Total Protein: 8.4 g/dL — ABNORMAL HIGH (ref 6.5–8.1)

## 2018-02-16 LAB — I-STAT BETA HCG BLOOD, ED (MC, WL, AP ONLY): I-stat hCG, quantitative: <5 m[IU]/mL (ref <5)

## 2018-02-16 LAB — SALICYLATE LEVEL: Salicylate Lvl: <7 mg/dL (ref 2.8–30.0)

## 2018-02-16 LAB — ACETAMINOPHEN LEVEL: Acetaminophen (Tylenol), Serum: <10 ug/mL — ABNORMAL LOW (ref 10–30)

## 2018-02-16 LAB — ETHANOL: Alcohol, Ethyl (B): <10 mg/dL (ref <10)

## 2018-02-16 NOTE — H&P (Signed)
Behavioral Health Medical Screening Exam  Shelley Baldwin is an 22 y.o. female. Presenting with friends at [facility], with the patient endorsing MDD with SI plan,intent and means. She reportedly reached out to her friends today because she has been experiencing recurring suicidal thoughts of walking into traffic or cutting her wrist and she fears she will act on these thoughts  Total Time spent with patient: 20 minutes  Psychiatric Specialty Exam: Physical Exam  Constitutional: She is oriented to person, place, and time. She appears well-developed and well-nourished. No distress.  HENT:  Head: Normocephalic.  Eyes: Pupils are equal, round, and reactive to light.  Respiratory: Effort normal and breath sounds normal. No respiratory distress.  Neurological: She is alert and oriented to person, place, and time. No cranial nerve deficit.  Skin: Skin is warm and dry. She is not diaphoretic.  Psychiatric: Her speech is normal. Judgment normal. Her mood appears anxious. She is withdrawn. She exhibits a depressed mood. She expresses suicidal ideation. She expresses suicidal plans.    Review of Systems  Constitutional: Negative for chills, diaphoresis, fever, malaise/fatigue and weight loss.  Psychiatric/Behavioral: Positive for depression, substance abuse and suicidal ideas.  All other systems reviewed and are negative.   There were no vitals taken for this visit.There is no height or weight on file to calculate BMI.  General Appearance: Casual  Eye Contact:  Good  Speech:  Clear and Coherent  Volume:  Normal  Mood:  Depressed  Affect:  Congruent  Thought Process:  Goal Directed  Orientation:  Full (Time, Place, and Person)  Thought Content:  Logical  Suicidal Thoughts:  Yes.  with intent/plan  Homicidal Thoughts:  No  Memory:  Immediate;   Fair  Judgement:  Fair  Insight:  Fair  Psychomotor Activity:  Normal  Concentration: Concentration: Fair  Recall:  [employer] of Knowledge:Fair   Language: Fair  Akathisia:  Negative  Handed:  Right  AIMS (if indicated):     Assets:  Desire for Improvement  Sleep:       Musculoskeletal: Strength & Muscle Tone: within normal limits Gait & Station: normal Patient leans: N/A  There were no vitals taken for this visit.  Recommendations:  Based on my evaluation the patient does not appear to have an emergency medical condition.  Kerry Hough, PA-C 02/16/2018, 9:29 PM

## 2018-02-16 NOTE — ED Triage Notes (Signed)
Pt is alert and oriented x 4 and is verbally responsive. Pt reports that she has SI, and attempted to by slitting her wrist.  Pt does reports some auditory hallucination at times but cant make out what is being said. Pt is calm and cooperative. Pt is voluntary. Pt has friends in who escorted pt.

## 2018-02-16 NOTE — ED Provider Notes (Signed)
[facility]-EMERGENCY DEPT Provider Note   CSN: [id number] Arrival date & time: 02/16/18  2037     History   Chief Complaint Chief Complaint  Patient presents with  . Suicidal    HPI Shelley Baldwin is a 22 y.o. female issues of diabetes who presents for evaluation of suicidal ideations.  Patient reports that she has had suicidal thoughts in her mid last year.  Does report over the last few weeks, the suicidal thoughts have become more severe and constant.  She states she has been having issues with her ex-boyfriend but does not divulge what those issues are.  She states that today, she was going to attempt to slit her wrist.  She reports an previous suicide attempt about 1 year ago where she tried to slit her wrist.  Patient also endorses auditory hallucinations.  She states that she hears him call her name but she cannot make out what they are telling her to do.  She denies any HI, visual hallucinations.  She does report that she smokes.  She does state that she used alcohol last night.  Denies any IV drug use, cocaine use. She denies any CP, SOB, abdominal pain.   The history is provided by the patient.    Past Medical History:  Diagnosis Date  . Diabetes mellitus without complication (HCC)   . Nasal turbinate hypertrophy 07/2013    Patient Active Problem List   Diagnosis Date Noted  . Type 2 diabetes mellitus (HCC) 10/12/2015  . Elevated blood pressure 10/12/2015  . Morbid obesity (HCC) 10/12/2015  . IUD (intrauterine device) in place 10/12/2015  . Tobacco abuse 10/12/2015    Past Surgical History:  Procedure Laterality Date  . ADENOIDECTOMY    . TURBINATE REDUCTION     x 3, per mother  . TURBINATE REDUCTION Bilateral 08/05/2013   Procedure: BILATERAL TURBINATE REDUCTION;  Surgeon: Darletta Moll, MD;  Location: [facility];  Service: ENT;  Laterality: Bilateral;     OB History    Gravida  1   Para      Term      Preterm      AB     Living        SAB      TAB      Ectopic      Multiple      Live Births               Home Medications    Prior to Admission medications   Medication Sig Start Date End Date Taking? Authorizing Provider  albuterol (PROVENTIL HFA;VENTOLIN HFA) 108 (90 Base) MCG/ACT inhaler Inhale 1-2 puffs into the lungs every 6 (six) hours as needed for wheezing or shortness of breath.   Yes [provider]  ibuprofen (ADVIL,MOTRIN) 800 MG tablet Take 1 tablet (800 mg total) by mouth 3 (three) times daily. 12/21/16  Yes Law, Alexandra M, PA-C  glucose blood (ACCU-CHEK GUIDE) test strip Use as 3 times daily 01/18/16   Johna Sheriff, MD  Insulin Glargine (LANTUS SOLOSTAR) 100 UNIT/ML Solostar Pen Inject 20 Units into the skin 2 (two) times daily. Patient not taking: Reported on 02/16/2018 01/25/16   Johna Sheriff, MD  Insulin Pen Needle (EXEL COMFORT POINT PEN NEEDLE) 31G X 4 MM MISC 1 application by Does not apply route daily. 11/02/15   Johna Sheriff, MD  Lancets (ACCU-CHEK MULTICLIX) lancets Use as instructed 09/19/15   Mechele Claude, MD  sitaGLIPtin-metformin (JANUMET) 50-1000 MG tablet Take 1 tablet by mouth 2 (two) times daily with a meal. Patient not taking: Reported on 02/16/2018 01/15/16   Dettinger, Elige Radon, MD    Family History Family History  Problem Relation Age of Onset  . Diabetes Mother     Social History Social History   Tobacco Use  . Smoking status: Current Every Day Smoker    Packs/day: 0.50    Years: 2.00    Pack years: 1.00    Types: Cigarettes  . Smokeless tobacco: Never Used  . Tobacco comment: 5-6 cig./day  Substance Use Topics  . Alcohol use: Yes    Comment: occasionally  . Drug use: No     Allergies   Milk-related compounds   Review of Systems Review of Systems  Respiratory: Negative for shortness of breath.   Cardiovascular: Negative for chest pain.  Gastrointestinal: Negative for abdominal pain.  Psychiatric/Behavioral:  Positive for hallucinations and suicidal ideas.  All other systems reviewed and are negative.    Physical Exam Updated Vital Signs BP (!) 154/97 (BP Location: Right Arm) Comment: RN notified  Pulse 81   Temp 99.2 F (37.3 C) (Oral)   Resp 20   SpO2 100%   Physical Exam  Constitutional: She is oriented to person, place, and time. She appears well-developed and well-nourished.  HENT:  Head: Normocephalic and atraumatic.  Mouth/Throat: Oropharynx is clear and moist and mucous membranes are normal.  Eyes: Pupils are equal, round, and reactive to light. Conjunctivae, EOM and lids are normal.  Neck: Full passive range of motion without pain.  Cardiovascular: Normal rate, regular rhythm, normal heart sounds and normal pulses. Exam reveals no gallop and no friction rub.  No murmur heard. Pulmonary/Chest: Effort normal and breath sounds normal.  Lungs clear to auscultation bilaterally.  Symmetric chest rise.  No wheezing, rales, rhonchi.  Abdominal: Soft. Normal appearance. There is no tenderness. There is no rigidity and no guarding.  Abdomen is soft, non-distended, non-tender. No rigidity, No guarding. No peritoneal signs.  Musculoskeletal: Normal range of motion.  Neurological: She is alert and oriented to person, place, and time.  Skin: Skin is warm and dry. Capillary refill takes less than 2 seconds.  No wounds noted to wrist.  Psychiatric: She has a normal mood and affect. Her speech is normal. She expresses suicidal ideation. She expresses no homicidal ideation. She expresses suicidal plans. She expresses no homicidal plans.  Answers questions appropriately.  Makes good eye contact.  Nursing note and vitals reviewed.    ED Treatments / Results  Labs (all labs ordered are listed, but only abnormal results are displayed) Labs Reviewed  COMPREHENSIVE METABOLIC PANEL - Abnormal; Notable for the following components:      Result Value   Total Protein 8.4 (*)    All other  components within normal limits  ACETAMINOPHEN LEVEL - Abnormal; Notable for the following components:   Acetaminophen (Tylenol), Serum <10 (*)    All other components within normal limits  ETHANOL  SALICYLATE LEVEL  CBC  RAPID URINE DRUG SCREEN, HOSP PERFORMED  I-STAT BETA HCG BLOOD, ED ([facility], [facility], [facility] ONLY)    EKG None  Radiology No results found.  Procedures Procedures (including critical care time)  Medications Ordered in ED Medications  metFORMIN (GLUCOPHAGE) tablet 1,000 mg (has no administration in time range)  linagliptin (TRADJENTA) tablet 5 mg (has no administration in time range)     Initial Impression / Assessment and Plan / ED Course  I  have reviewed the triage vital signs and the nursing notes.  Pertinent labs & imaging results that were available during my care of the patient were reviewed by me and considered in my medical decision making (see chart for details).     22 year old female presents for evaluation of suicidal ideations.  Reports that she has been having issues with her ex-boyfriend which has culminated in worsening suicidal thoughts.  Attempted to slit her wrist today.  She states that she was going to do it but then stopped.  Also endorses auditory hallucinations. Patient is afebrile, non-toxic appearing, sitting comfortably on examination table. Vital signs reviewed and stable.  Plan for medical clearance labs, TTS consultation.  I-STAT beta negative.  Salicylate level unremarkable.  Acetaminophen level unremarkable.  Ethanol level unremarkable.  CBC without any significant leukocytosis or anemia.  CMP is unremarkable.  Patient signed out to Elpidio Anis, PA-C with TTS consult pending.    Final Clinical Impressions(s) / ED Diagnoses   Final diagnoses:  Suicidal ideation    ED Discharge Orders    None       Maxwell Caul, PA-C 02/17/18 0028    Little, Ambrose Finland, MD 02/18/18 [id number]

## 2018-02-16 NOTE — ED Notes (Signed)
Bed: [facility] Expected date:  Expected time:  Means of arrival:  Comments: rm #[id number]
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
## 2018-02-16 NOTE — ED Notes (Signed)
Pt presents with complaint of Depression, anxiousness and reports being stressed out over her ex-boyfriend.  Pt currently lives with ex-boyfriend.  Pt reports she thought about slitting her wrist earlier today, and slit her wrist x 10 mos ago.  Pt reports she has been unemployed x 1 mos.  Denies HI or AVH.  Denies feeling hopeless.  Denies drug use and states she drinks liquor every 2-3 days.  A&O x 3, no distress noted, calm & cooperative at present.  Monitoring for safety, Q 15 min checks in effect.

## 2018-02-17 ENCOUNTER — Encounter (HOSPITAL_COMMUNITY): Payer: Self-pay | Admitting: *Deleted

## 2018-02-17 ENCOUNTER — Inpatient Hospital Stay (HOSPITAL_COMMUNITY)
Admission: AD | Admit: 2018-02-17 | Discharge: 2018-02-19 | DRG: 885 | Disposition: A | Discharge status: Home / Self Care | Payer: Federal, State, Local not specified - Other | Source: Intra-hospital | Attending: Psychiatry | Admitting: Psychiatry

## 2018-02-17 ENCOUNTER — Other Ambulatory Visit: Payer: Self-pay

## 2018-02-17 DIAGNOSIS — Z560 Unemployment, unspecified: Secondary | ICD-10-CM | POA: Diagnosis not present

## 2018-02-17 DIAGNOSIS — F1010 Alcohol abuse, uncomplicated: Secondary | ICD-10-CM | POA: Diagnosis present

## 2018-02-17 DIAGNOSIS — F322 Major depressive disorder, single episode, severe without psychotic features: Secondary | ICD-10-CM | POA: Diagnosis present

## 2018-02-17 DIAGNOSIS — E119 Type 2 diabetes mellitus without complications: Secondary | ICD-10-CM | POA: Diagnosis present

## 2018-02-17 DIAGNOSIS — F17210 Nicotine dependence, cigarettes, uncomplicated: Secondary | ICD-10-CM | POA: Diagnosis present

## 2018-02-17 DIAGNOSIS — Z818 Family history of other mental and behavioral disorders: Secondary | ICD-10-CM | POA: Diagnosis not present

## 2018-02-17 DIAGNOSIS — R45851 Suicidal ideations: Secondary | ICD-10-CM | POA: Diagnosis present

## 2018-02-17 DIAGNOSIS — Z833 Family history of diabetes mellitus: Secondary | ICD-10-CM

## 2018-02-17 DIAGNOSIS — Z811 Family history of alcohol abuse and dependence: Secondary | ICD-10-CM | POA: Diagnosis not present

## 2018-02-17 LAB — CBG MONITORING, ED: Glucose-Capillary: 108 mg/dL — ABNORMAL HIGH (ref 70–99)

## 2018-02-17 LAB — RAPID URINE DRUG SCREEN, HOSP PERFORMED
Amphetamines: NOT DETECTED
Barbiturates: NOT DETECTED
Benzodiazepines: NOT DETECTED
Cocaine: NOT DETECTED
OPIATES: NOT DETECTED
TETRAHYDROCANNABINOL: NOT DETECTED

## 2018-02-17 MED ORDER — LINAGLIPTIN 5 MG PO TABS
5.0000 mg | ORAL_TABLET | Freq: Every day | ORAL | Status: DC
  Filled 2018-02-17: qty 1

## 2018-02-17 MED ORDER — LINAGLIPTIN 5 MG PO TABS
5.0000 mg | ORAL_TABLET | Freq: Every day | ORAL | Status: DC
  Filled 2018-02-17: qty 1
  Filled 2018-02-17: qty 7
  Filled 2018-02-17 (×3): qty 1

## 2018-02-17 MED ORDER — ACETAMINOPHEN 325 MG PO TABS: 650.0000 mg | ORAL_TABLET | Freq: Four times a day (QID) | ORAL | Status: DC | PRN

## 2018-02-17 MED ORDER — INSULIN PEN NEEDLE 31G X 4 MM MISC: 1.0000 "application " | Freq: Every day | Status: DC

## 2018-02-17 MED ORDER — ALBUTEROL SULFATE HFA 108 (90 BASE) MCG/ACT IN AERS
1.0000 | INHALATION_SPRAY | Freq: Four times a day (QID) | RESPIRATORY_TRACT | Status: DC | PRN
  Filled 2018-02-17: qty 6.7

## 2018-02-17 MED ORDER — ALUM & MAG HYDROXIDE-SIMETH 200-200-20 MG/5ML PO SUSP: 30.0000 mL | ORAL | Status: DC | PRN

## 2018-02-17 MED ORDER — METFORMIN HCL 500 MG PO TABS
1000.0000 mg | ORAL_TABLET | Freq: Two times a day (BID) | ORAL | Status: DC
  Filled 2018-02-17: qty 28
  Filled 2018-02-17 (×7): qty 2
  Filled 2018-02-17: qty 28

## 2018-02-17 MED ORDER — METFORMIN HCL 500 MG PO TABS
1000.0000 mg | ORAL_TABLET | Freq: Two times a day (BID) | ORAL | Status: DC
  Administered 2018-02-17: 1000 mg via ORAL
  Filled 2018-02-17: qty 2

## 2018-02-17 MED ORDER — HYDROXYZINE HCL 25 MG PO TABS
25.0000 mg | ORAL_TABLET | Freq: Three times a day (TID) | ORAL | Status: DC | PRN
  Administered 2018-02-17 – 2018-02-18 (×2): 25 mg via ORAL
  Filled 2018-02-17: qty 10
  Filled 2018-02-17 (×2): qty 1

## 2018-02-17 MED ORDER — TRAZODONE HCL 50 MG PO TABS
50.0000 mg | ORAL_TABLET | Freq: Every evening | ORAL | Status: DC | PRN
  Filled 2018-02-17: qty 1

## 2018-02-17 MED ORDER — MAGNESIUM HYDROXIDE 400 MG/5ML PO SUSP: 30.0000 mL | Freq: Every day | ORAL | Status: DC | PRN

## 2018-02-17 MED ORDER — NICOTINE 21 MG/24HR TD PT24
21.0000 mg | MEDICATED_PATCH | Freq: Every day | TRANSDERMAL | Status: DC
  Filled 2018-02-17 (×4): qty 1

## 2018-02-17 NOTE — ED Notes (Signed)
Called report to [facility] at behavioral health hospital.  [place] to be called at 3:30.

## 2018-02-17 NOTE — Progress Notes (Signed)
D.  Pt is a new admission to the unit today, pleasant but anxious on approach.  No complaints voiced.  Pt was positive for evening wrap up group, minimal but appropriate interaction within milieu.  Pt denies SI/HI/AVH at this time.  A.  Support and encouragement offered, medication given as ordered  R.  Pt remains safe on the unit, will continue to monitor.

## 2018-02-17 NOTE — ED Notes (Signed)
Shelley Baldwin called for transport.  Shelley Baldwin to arrive at approximately 4pm.

## 2018-02-17 NOTE — Tx Team (Signed)
Initial Treatment Plan 02/17/2018 5:15 PM [name] NFA:[id number]    PATIENT STRESSORS: Financial difficulties Marital or family conflict Other: various deaths past 6 weeks   PATIENT STRENGTHS: Ability for insight Active sense of humor Average or above average intelligence Capable of independent living General fund of knowledge Motivation for treatment/growth   PATIENT IDENTIFIED PROBLEMS: Depression Suicidal thoughts "I want to talk to a therapist"                     DISCHARGE CRITERIA:  Ability to meet basic life and health needs Improved stabilization in mood, thinking, and/or behavior Reduction of life-threatening or endangering symptoms to within safe limits Verbal commitment to aftercare and medication compliance  PRELIMINARY DISCHARGE PLAN: Attend aftercare/continuing care group Return to previous living arrangement  PATIENT/FAMILY INVOLVEMENT: This treatment plan has been presented to and reviewed with the patient, Shelley Baldwin, and/or family member, .  The patient and family have been given the opportunity to ask questions and make suggestions.  Mirta Mally, [place], [place] 02/17/2018, 5:15 PM

## 2018-02-17 NOTE — ED Notes (Signed)
Patient eating breakfast saying she feels better today.  Calm and cooperative.  Took po medications without difficulty.

## 2018-02-17 NOTE — Progress Notes (Signed)
Shelley Baldwin is a 22 year old female pt admitted on voluntary basis. On admission, she currently denies any SI and spoke about how she is not feeling as bad today as when she came in yesterday. She spoke about how she had some recent losses in her life and some family stressors. She reports that she is currently unemployed and also does not have any health insurance. She does endorse drinking alcohol but reports that she does not do on a daily basis and does not display any overt signs or symptoms of withdrawal. She denies any other substance usage. She does report being a type 2 diabetic and is supposed to be on medications but cannot afford them. She reports that she lives with her boyfriend, his sister and cousin and reports that she will return there once she gets discharged. Alley was escorted to the unit, oriented to the milieu and safety maintained.

## 2018-02-17 NOTE — Consult Note (Addendum)
[facility] Face-to-Face Psychiatry Consult   Reason for Consult:  Suicidal Ideation Referring Physician:  EDP Patient Identification: Shelley Baldwin MRN:  [id number] Principal Diagnosis: MDD (major depressive disorder), single episode, severe , no psychosis ([place]) Diagnosis:   Patient Active Problem List   Diagnosis Date Noted  . Type 2 diabetes mellitus ([place]) [E11.9] 10/12/2015  . Elevated blood pressure [R03.0] 10/12/2015  . Morbid obesity ([place]) [E66.01] 10/12/2015  . IUD (intrauterine device) in place [Z97.5] 10/12/2015  . Tobacco abuse [Z72.0] 10/12/2015    Total Time spent with patient: 30 minutes  Subjective:   Shelley Baldwin is a 22 y.o. female patient admitted with suicidal ideation.  HPI:  Pt was seen and chart reviewed with treatment team and Dr Darleene Cleaver. Pt denies suicidal/homicidal ideation, denies auditory/visual hallucinations and does not appear to be responding to internal stimuli. Pt stated she and her boyfriend had an argument and she became suicidal and threatened to cut her wrists. Pt stated that everything "just became too much for her." Pt stated she was going to attempt to cut her wrists earlier this year but didn't. She has a 11 year old child who is currently with her mother. Pt is unemployed and stated recently her grandmother died and her kitten just got hit by a car. Pt is sad, depressed, and has not been sleeping more than two hours per night for the past month. Pt's UDS and BAL are negative. Pt currently has no psychiatrist or therapist and stated she does not take any psychiatric medications. Pt would benefit form an inpatient psychiatric admission for crisis stabilization and medication management.   Past Psychiatric History: As above  Risk to Self:   Risk to Others:   Prior Inpatient Therapy:   Prior Outpatient Therapy:    Past Medical History:  Past Medical History:  Diagnosis Date  . Diabetes mellitus without complication ([place])   . Nasal turbinate hypertrophy  07/2013    Past Surgical History:  Procedure Laterality Date  . ADENOIDECTOMY    . TURBINATE REDUCTION     x 3, per mother  . TURBINATE REDUCTION Bilateral 08/05/2013   Procedure: BILATERAL TURBINATE REDUCTION;  Surgeon: Ascencion Dike, MD;  Location: [place];  Service: ENT;  Laterality: Bilateral;   Family History:  Family History  Problem Relation Age of Onset  . Diabetes Mother    Family Psychiatric  History: Pt declined to give this information Social History:  Social History   Substance and Sexual Activity  Alcohol Use Yes   Comment: occasionally     Social History   Substance and Sexual Activity  Drug Use No    Social History   Socioeconomic History  . Marital status: Single    Spouse name: Not on file  . Number of children: Not on file  . Years of education: Not on file  . Highest education level: Not on file  Occupational History  . Not on file  Social Needs  . Financial resource strain: Not on file  . Food insecurity:    Worry: Not on file    Inability: Not on file  . Transportation needs:    Medical: Not on file    Non-medical: Not on file  Tobacco Use  . Smoking status: Current Every Day Smoker    Packs/day: 0.50    Years: 2.00    Pack years: 1.00    Types: Cigarettes  . Smokeless tobacco: Never Used  . Tobacco comment: 5-6 cig./day  Substance  and Sexual Activity  . Alcohol use: Yes    Comment: occasionally  . Drug use: No  . Sexual activity: Not on file  Lifestyle  . Physical activity:    Days per week: Not on file    Minutes per session: Not on file  . Stress: Not on file  Relationships  . Social connections:    Talks on phone: Not on file    Gets together: Not on file    Attends religious service: Not on file    Active member of club or organization: Not on file    Attends meetings of clubs or organizations: Not on file    Relationship status: Not on file  Other Topics Concern  . Not on file  Social History Narrative   . Not on file   Additional Social History:    Allergies:   Allergies  Allergen Reactions  . Milk-Related Compounds     Whole milk    Labs:  Results for orders placed or performed during the hospital encounter of 02/16/18 (from the past 48 hour(s))  Comprehensive metabolic panel     Status: Abnormal   Collection Time: 02/16/18  9:11 PM  Result Value Ref Range   Sodium 139 135 - 145 mmol/L   Potassium 3.6 3.5 - 5.1 mmol/L   Chloride 104 98 - 111 mmol/L   CO2 26 22 - 32 mmol/L   Glucose, Bld 84 70 - 99 mg/dL   BUN 8 6 - 20 mg/dL   Creatinine, Ser 0.54 0.44 - 1.00 mg/dL   Calcium 9.2 8.9 - 10.3 mg/dL   Total Protein 8.4 (H) 6.5 - 8.1 g/dL   Albumin 3.7 3.5 - 5.0 g/dL   AST 23 15 - 41 U/L   ALT 20 0 - 44 U/L   Alkaline Phosphatase 68 38 - 126 U/L   Total Bilirubin 0.6 0.3 - 1.2 mg/dL   GFR calc non Af Amer >60 >60 mL/min   GFR calc Af Amer >60 >60 mL/min    Comment: (NOTE) The eGFR has been calculated using the CKD EPI equation. This calculation has not been validated in all clinical situations. eGFR's persistently <60 mL/min signify possible Chronic Kidney Disease.    Anion gap 9 5 - 15    Comment: Performed at [facility], [place] [address], [place], [place] [address]  Ethanol     Status: None   Collection Time: 02/16/18  9:11 PM  Result Value Ref Range   Alcohol, Ethyl (B) <10 <10 mg/dL    Comment: (NOTE) Lowest detectable limit for serum alcohol is 10 mg/dL. For medical purposes only. Performed at [facility], [place] [address], [place], [place] [address]   Salicylate level     Status: None   Collection Time: 02/16/18  9:11 PM  Result Value Ref Range   Salicylate Lvl <5.3 2.8 - 30.0 mg/dL    Comment: Performed at [facility], [place] [address] [place], [place] [address]  Acetaminophen level     Status: Abnormal   Collection Time: 02/16/18  9:11 PM  Result Value Ref Range   Acetaminophen (Tylenol), Serum  <10 (L) 10 - 30 ug/mL    Comment: (NOTE) Therapeutic concentrations vary significantly. A range of 10-30 ug/mL  may be an effective concentration for many patients. However, some  are best treated at concentrations outside of this range. Acetaminophen concentrations >150 ug/mL at 4 hours after ingestion  and >50 ug/mL at 12 hours after  ingestion are often associated with  toxic reactions. Performed at [facility], [place] [address] [place], [place] [address]   cbc     Status: None   Collection Time: 02/16/18  9:11 PM  Result Value Ref Range   WBC 6.4 4.0 - 10.5 K/uL   RBC 4.46 3.87 - 5.11 MIL/uL   Hemoglobin 13.6 12.0 - 15.0 g/dL   HCT 41.5 36.0 - 46.0 %   MCV 93.0 80.0 - 100.0 fL   MCH 30.5 26.0 - 34.0 pg   MCHC 32.8 30.0 - 36.0 g/dL   RDW 13.6 11.5 - 15.5 %   Platelets 240 150 - 400 K/uL   nRBC 0.0 0.0 - 0.2 %    Comment: Performed at [facility], [place] [address], [place], [place] [address]  I-Stat beta hCG blood, ED     Status: None   Collection Time: 02/16/18 10:30 PM  Result Value Ref Range   I-stat hCG, quantitative <5.0 <5 mIU/mL   Comment 3            Comment:   GEST. AGE      CONC.  (mIU/mL)   <=1 WEEK        5 - 50     2 WEEKS       50 - 500     3 WEEKS       100 - 10,000     4 WEEKS     1,000 - 30,000        FEMALE AND NON-PREGNANT FEMALE:     LESS THAN 5 mIU/mL   Rapid urine drug screen (hospital performed)     Status: None   Collection Time: 02/17/18  6:24 AM  Result Value Ref Range   Opiates NONE DETECTED NONE DETECTED   Cocaine NONE DETECTED NONE DETECTED   Benzodiazepines NONE DETECTED NONE DETECTED   Amphetamines NONE DETECTED NONE DETECTED   Tetrahydrocannabinol NONE DETECTED NONE DETECTED   Barbiturates NONE DETECTED NONE DETECTED    Comment: (NOTE) DRUG SCREEN FOR MEDICAL PURPOSES ONLY.  IF CONFIRMATION IS NEEDED FOR ANY PURPOSE, NOTIFY LAB WITHIN 5 DAYS. LOWEST DETECTABLE LIMITS FOR URINE DRUG SCREEN Drug  Class                     Cutoff (ng/mL) Amphetamine and metabolites    1000 Barbiturate and metabolites    200 Benzodiazepine                 810 Tricyclics and metabolites     300 Opiates and metabolites        300 Cocaine and metabolites        300 THC                            50 Performed at [facility], [place] [address] [place], [place] [address]   CBG monitoring, ED     Status: Abnormal   Collection Time: 02/17/18  8:07 AM  Result Value Ref Range   Glucose-Capillary 108 (H) 70 - 99 mg/dL   Comment 1 Notify RN    Comment 2 Document in Chart     Current Facility-Administered Medications  Medication Dose Route Frequency Provider Last Rate Last Dose  . linagliptin (TRADJENTA) tablet 5 mg  5 mg Oral Daily Layden, Lindsey A, PA-C      . metFORMIN (GLUCOPHAGE) tablet 1,000 mg  1,000 mg Oral  BID WC [place] Lanius A, PA-C   1,000 mg at 02/17/18 3335   Current Outpatient Medications  Medication Sig Dispense Refill  . albuterol (PROVENTIL HFA;VENTOLIN HFA) 108 (90 Base) MCG/ACT inhaler Inhale 1-2 puffs into the lungs every 6 (six) hours as needed for wheezing or shortness of breath.    Marland Kitchen ibuprofen (ADVIL,MOTRIN) 800 MG tablet Take 1 tablet (800 mg total) by mouth 3 (three) times daily. 21 tablet 0  . glucose blood (ACCU-CHEK GUIDE) test strip Use as 3 times daily 100 each 2  . Insulin Glargine (LANTUS SOLOSTAR) 100 UNIT/ML Solostar Pen Inject 20 Units into the skin 2 (two) times daily. (Patient not taking: Reported on 02/16/2018) 1 pen 2  . Insulin Pen Needle (EXEL COMFORT POINT PEN NEEDLE) 31G X 4 MM MISC 1 application by Does not apply route daily. 90 each 1  . Lancets (ACCU-CHEK MULTICLIX) lancets Use as instructed 100 each 2  . sitaGLIPtin-metformin (JANUMET) 50-1000 MG tablet Take 1 tablet by mouth 2 (two) times daily with a meal. (Patient not taking: Reported on 02/16/2018) 180 tablet 0    Musculoskeletal: Strength & Muscle Tone: within normal limits Gait &  Station: normal Patient leans: N/A  Psychiatric Specialty Exam: Physical Exam  ROS  Blood pressure (!) 142/84, pulse 63, temperature 97.7 F (36.5 C), temperature source Oral, resp. rate 15, SpO2 99 %.There is no height or weight on file to calculate BMI.  General Appearance: Casual  Eye Contact:  Good  Speech:  Clear and Coherent  Volume:  Normal  Mood:  Depressed  Affect:  Congruent and Depressed  Thought Process:  Coherent, Linear and Descriptions of Associations: Intact  Orientation:  Full (Time, Place, and Person)  Thought Content:  Logical  Suicidal Thoughts:  Yes.  without intent/plan  Homicidal Thoughts:  No  Memory:  Immediate;   Good Recent;   Good Remote;   Fair  Judgement:  Good  Insight:  Good  Psychomotor Activity:  Normal  Concentration:  Concentration: Good and Attention Span: Good  Recall:  Good  Fund of Knowledge:  Good  Language:  Good  Akathisia:  No  Handed:  Right  AIMS (if indicated):     Assets:  Agricultural consultant Housing Social Support  ADL's:  Intact  Cognition:  WNL  Sleep:        Treatment Plan Summary: Daily contact with patient to assess and evaluate symptoms and progress in treatment and Medication management (see MAR)  Disposition: Recommend psychiatric Inpatient admission when medically cleared.  Ethelene Hal, NP 02/17/2018 1:24 PM  Patient seen face-to-face for psychiatric evaluation, chart reviewed and case discussed with the physician extender and developed treatment plan. Reviewed the information documented and agree with the treatment plan. Corena Pilgrim, MD

## 2018-02-17 NOTE — Progress Notes (Signed)
Patient accepted to [facility].  Bed: 305-2  RN Call for report: [phone]  Patient is voluntarily and may arrive after 3:30pm.  Enid Cutter, LCSW-A Clinical Social Worker [phone]

## 2018-02-17 NOTE — Progress Notes (Signed)
Adult Psychoeducational Group Note  Date:  02/17/2018 Time:  9:28 PM  Group Topic/Focus:  Wrap-Up Group:   The focus of this group is to help patients review their daily goal of treatment and discuss progress on daily workbooks.  Participation Level:  Active  Participation Quality:  Appropriate  Affect:  Appropriate  Cognitive:  Appropriate  Insight: Appropriate  Engagement in Group:  Engaged  Modes of Intervention:  Discussion  Additional Comments: The patient expressed that she rates today a 8.the patient also said that her goal is to keep her spirits high.  Octavio Manns 02/17/2018, 9:28 PM

## 2018-02-17 NOTE — ED Notes (Addendum)
Urine specimen requested several times, pt states she is unable to give urine specimen at this time.

## 2018-02-18 DIAGNOSIS — F322 Major depressive disorder, single episode, severe without psychotic features: Principal | ICD-10-CM

## 2018-02-18 LAB — GLUCOSE, CAPILLARY: Glucose-Capillary: 110 mg/dL — ABNORMAL HIGH (ref 70–99)

## 2018-02-18 LAB — TSH: TSH: 2.409 u[IU]/mL (ref 0.350–4.500)

## 2018-02-18 NOTE — Progress Notes (Signed)
Patient did attend the evening speaker AA meeting.  

## 2018-02-18 NOTE — Progress Notes (Signed)
D. Pt presents with an anxious affect congruent with mood- calm, cooperative behavior- friendly upon approach. Per pt's self inventory, pt rates her depression, hopelessness and anxiety all 0's. Pt writes that her most important goal today is "keeping the faith and hope I can go home" and she will meet that goal by "prayer and group therapy". Pt currently denies SI/HI and AV hallucinations.  A. Labs and vitals monitored. Pt refused her Tradjenta and Glucophage this am, stating, "I don't take these at home and I don't need them".Pt supported emotionally and encouraged to express concerns and ask questions.   R. Pt remains safe with 15 minute checks. Will continue POC.

## 2018-02-18 NOTE — [facility] Suicide Risk Assessment (Signed)
[facility] Admission Suicide Risk Assessment   Nursing information obtained from:  Patient Demographic factors:  Adolescent or young adult, Low socioeconomic status, Unemployed Current Mental Status:  NA Loss Factors:  Loss of significant relationship, Financial problems / change in socioeconomic status Historical Factors:  Family history of mental illness or substance abuse Risk Reduction Factors:  Responsible for children under 22 years of age, Positive therapeutic relationship, Positive coping skills or problem solving skills  Total Time spent with patient: 20 minutes Principal Problem: <principal problem not specified> Diagnosis:   Patient Active Problem List   Diagnosis Date Noted  . MDD (major depressive disorder), single episode, severe , no psychosis ([facility]) [F32.2] 02/17/2018  . Type 2 diabetes mellitus ([facility]) [E11.9] 10/12/2015  . Elevated blood pressure [R03.0] 10/12/2015  . Morbid obesity ([facility]) [E66.01] 10/12/2015  . IUD (intrauterine device) in place [Z97.5] 10/12/2015  . Tobacco abuse [Z72.0] 10/12/2015   Subjective Data: Patient is seen and examined.  Patient is a 22 year old female with essentially negative past psychiatric history who presented to the [facility] emergency department on 02/16/2018 with suicidal ideation.  The patient stated that she had been depressed for approximately 1 year.  She apparently had reached out to friends on the date of admission because she was experiencing recurrent suicidal thoughts of walking into traffic, cutting her wrist or other means of harming herself.  She stated she got into an argument with her boyfriend.  She stated these arguments had been taking place more frequently.  She apparently contacted her boyfriend sister who lived in the home with them and told her that "she is ready to end it all".  She was taken to the emergency room, and evaluated.  She admitted that she had recent increased psychosocial stressors with the death of her  grandmother, and also the death of several cats who had recently been delivered from the pregnant mother cat who was also killed.  She admitted to alcohol intake at least every other to every 3 days.  She stated she had drank enough alcohol over the last year to where she gained 20 to 30 pounds.  She does have a history of diabetes.  Review of the laboratories showed no TSH being obtained.  Her blood alcohol on admission was negative.  Her liver function enzymes were normal.  During the interview this morning she denied suicidal ideation, and was offered psychiatric medication but the patient declined at this point.  She denied any previous history of alcohol withdrawal symptoms.  She stated she had gone without alcohol for over 3 weeks within the last 12 months.  She denied any previous suicide attempts, psychiatric admissions, or psychiatric medications.  She does have a family history of alcohol abuse as well as schizophrenia.  She was admitted to the hospital for evaluation and stabilization.  Continued Clinical Symptoms:  Alcohol Use Disorder Identification Test Final Score (AUDIT): 15 The "Alcohol Use Disorders Identification Test", Guidelines for Use in Primary Care, Second Edition.  World Science writer [facility]). Score between 0-7:  no or low risk or alcohol related problems. Score between 8-15:  moderate risk of alcohol related problems. Score between 16-19:  high risk of alcohol related problems. Score 20 or above:  warrants further diagnostic evaluation for alcohol dependence and treatment.   CLINICAL FACTORS:   Depression:   Comorbid alcohol abuse/dependence Impulsivity Alcohol/Substance Abuse/Dependencies   Musculoskeletal: Strength & Muscle Tone: within normal limits Gait & Station: normal Patient leans: N/A  Psychiatric Specialty Exam: Physical  Exam  Nursing note and vitals reviewed. Constitutional: She is oriented to person, place, and time. She appears well-developed and  well-nourished.  HENT:  Head: Normocephalic and atraumatic.  Respiratory: Effort normal.  Neurological: She is alert and oriented to person, place, and time.    ROS  Blood pressure (!) 127/95, pulse (!) 112, temperature (!) 97.5 F (36.4 C), temperature source Oral, resp. rate 20, height 5\' 6"  (1.676 m), weight 120.7 kg.Body mass index is 42.93 kg/m.  General Appearance: Casual  Eye Contact:  Good  Speech:  Normal Rate  Volume:  Normal  Mood:  Anxious  Affect:  Congruent  Thought Process:  Coherent and Descriptions of Associations: Intact  Orientation:  Full (Time, Place, and Person)  Thought Content:  Logical  Suicidal Thoughts:  No  Homicidal Thoughts:  No  Memory:  Immediate;   Fair Recent;   Fair Remote;   Fair  Judgement:  Intact  Insight:  Fair  Psychomotor Activity:  Normal  Concentration:  Concentration: Fair and Attention Span: Fair  Recall:  [employer] of Knowledge:  Fair  Language:  Fair  Akathisia:  Negative  Handed:  Right  AIMS (if indicated):     Assets:  Communication Skills Desire for Improvement Housing Physical Health Resilience  ADL's:  Intact  Cognition:  WNL  Sleep:  Number of Hours: 6.75      COGNITIVE FEATURES THAT CONTRIBUTE TO RISK:  None    SUICIDE RISK:   Minimal: No identifiable suicidal ideation.  Patients presenting with no risk factors but with morbid ruminations; may be classified as minimal risk based on the severity of the depressive symptoms  PLAN OF CARE: Patient is seen and examined.  Patient is a 22 year old female with the above-stated past psychiatric history who was admitted secondary to suicidal ideation and concern for alcohol dependence.  She currently denies any suicidal ideation.  She is not showing any evidence of alcohol withdrawal at this point.  We will monitor her over the next 24 hours.  She has had some increased depression and anxiety, but is declined medication at this time.  Because of her weight gain we will  check TSH on her.  She will be monitored for alcohol withdrawal symptoms as well as suicidal ideation.  She will be placed on 15-minute checks.  Her diabetic medications will be continued.  If everything goes well over the next 24 hours we will decide disposition at that time.  I certify that inpatient services furnished can reasonably be expected to improve the patient's condition.   Antonieta Pert, MD 02/18/2018, 9:10 AM
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
## 2018-02-18 NOTE — [facility] Group Notes (Signed)
[facility] Group Notes:  (Nursing)  Date:  02/18/2018  Time:130 PM Type of Therapy:  Nurse Education  Participation Level:  Active  Participation Quality:  Appropriate and Attentive  Affect:  Appropriate  Cognitive:  Alert and Appropriate  Insight:  Appropriate  Engagement in Group:  Engaged  Modes of Intervention:  Discussion and Education  Summary of Progress/Problems: Nurse led psycho educational group on sleep hygiene  Shela Nevin 02/18/2018, 3:03 PM

## 2018-02-18 NOTE — H&P (Signed)
Psychiatric Admission Assessment Adult  Patient Identification: Shelley Baldwin MRN:  [id number] Date of Evaluation:  02/18/2018 Chief Complaint:  MDD RECURRENT SEVERE WITHOUT PSYCHOTIC FEATURES Alcohol Use Disorder Principal Diagnosis: MDD (major depressive disorder), single episode, severe , no psychosis (HCC) Diagnosis:   Patient Active Problem List   Diagnosis Date Noted  . MDD (major depressive disorder), single episode, severe , no psychosis (HCC) [F32.2] 02/17/2018  . Type 2 diabetes mellitus (HCC) [E11.9] 10/12/2015  . Elevated blood pressure [R03.0] 10/12/2015  . Morbid obesity (HCC) [E66.01] 10/12/2015  . IUD (intrauterine device) in place [Z97.5] 10/12/2015  . Tobacco abuse [Z72.0] 10/12/2015   History of Present Illness: Associated Signs/Symptoms: Depression Symptoms:  depressed mood, fatigue, suicidal thoughts without plan, loss of energy/fatigue, weight gain, (Hypo) Manic Symptoms:  n/a Anxiety Symptoms:  Excessive Worry, Psychotic Symptoms:  none PTSD Symptoms: NA Total Time spent with patient: 45 minutes  Past Psychiatric History:  Patient is a 22 year old female with essentially negative past psychiatric history who presented to the [facility] emergency department on 02/16/2018 with suicidal ideation.  The patient stated that she had been depressed for approximately 1 year.  She apparently had reached out to friends on the date of admission because she was experiencing recurrent suicidal thoughts of walking into traffic, cutting her wrist or other means of harming herself.  She stated she got into an argument with her boyfriend.  She stated these arguments had been taking place more frequently.  She apparently contacted her boyfriend sister who lived in the home with them and told her that "she is ready to end it all".  She was taken to the emergency room, and evaluated.  She admitted that she had recent increased psychosocial stressors with the death of her grandmother,  and also the death of several cats who had recently been delivered from the pregnant mother cat who was also killed. 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  She admitted to alcohol intake at least every other to every 3 days.  She stated she had drank enough alcohol over the last year to where she gained 20 to 30 pounds. Her blood alcohol on admission was negative.  Her liver function enzymes were normal.    During the interview this morning she denied suicidal ideation, homicidal ideation or plans. She denied any audio-visual hallucinations or paranoia.She did admit to feeling depressed in the past year as a result of financial instability, relationship conflicts, her health, weight gain, death of her grandmother and most recently her pet cat. Feels the culmination of all these made her feel overwhelmed and "unable to go on anymore". Also admits she has been drinking alcohol more frequently in the past 1 year about two-fifths on the weekend. She denies any symptoms of alcohol withdrawal or daily use. She also denied any additional substance abuse present or in the past. Pt states she plans to move to a different city upon discharge so that she can become independent and "control my own life and take care of my son". She feels the therapy session today has been beneficial and would like to continue this outpatient. Is the patient at risk to self? No.  Has the patient been a risk to self in the past 6 months? No.  Has the patient been a risk to self within the distant past? No.  Is the patient a risk to others? No.  Has the patient been a risk to others in  the past 6 months? No.  Has the patient been a risk to others within the distant past? No.   Prior Inpatient Therapy:  Denies Prior Outpatient Therapy:  Denies  Alcohol Screening: 1. How often do you have a drink containing alcohol?: 2 to 3 times a week 2. How many drinks containing alcohol do you have on a typical  day when you are drinking?: 7, 8, or 9 3. How often do you have six or more drinks on one occasion?: Daily or almost daily AUDIT-C Score: 10 4. How often during the last year have you found that you were not able to stop drinking once you had started?: Never 5. How often during the last year have you failed to do what was normally expected from you becasue of drinking?: Never 6. How often during the last year have you needed a first drink in the morning to get yourself going after a heavy drinking session?: Never 7. How often during the last year have you had a feeling of guilt of remorse after drinking?: Never 8. How often during the last year have you been unable to remember what happened the night before because you had been drinking?: Less than monthly 9. Have you or someone else been injured as a result of your drinking?: No 10. Has a relative or friend or a doctor or another health worker been concerned about your drinking or suggested you cut down?: Yes, during the last year Alcohol Use Disorder Identification Test Final Score (AUDIT): 15 Intervention/Follow-up: AUDIT Score <7 follow-up not indicated Substance Abuse History in the last 12 months:  No. Consequences of Substance Abuse: NA Previous Psychotropic Medications: No  Psychological Evaluations: No  Past Medical History:  Past Medical History:  Diagnosis Date  . Diabetes mellitus without complication (HCC)   . Nasal turbinate hypertrophy 07/2013    Past Surgical History:  Procedure Laterality Date  . ADENOIDECTOMY    . TURBINATE REDUCTION     x 3, per mother  . TURBINATE REDUCTION Bilateral 08/05/2013   Procedure: BILATERAL TURBINATE REDUCTION;  Surgeon: Darletta Moll, MD;  Location:  SURGERY CENTER;  Service: ENT;  Laterality: Bilateral;   Family History:  Family History  Problem Relation Age of Onset  . Diabetes Mother    Family Psychiatric  History: As per patient family history of schizophrenia in mother and  grandmother.  Tobacco Screening: Have you used any form of tobacco in the last 30 days? (Cigarettes, Smokeless Tobacco, Cigars, and/or Pipes): Yes Tobacco use, Select all that apply: 5 or more cigarettes per day Are you interested in Tobacco Cessation Medications?: Yes, will notify MD for an order Counseled patient on smoking cessation including recognizing danger situations, developing coping skills and basic information about quitting provided: Refused/Declined practical counseling Social History:  Social History   Substance and Sexual Activity  Alcohol Use Yes   Comment: occasionally     Social History   Substance and Sexual Activity  Drug Use No    Additional Social History:      Allergies:   Allergies  Allergen Reactions  . Milk-Related Compounds     Whole milk   Lab Results:  Results for orders placed or performed during the hospital encounter of 02/17/18 (from the past 48 hour(s))  Glucose, capillary     Status: Abnormal   Collection Time: 02/18/18  6:12 AM  Result Value Ref Range   Glucose-Capillary 110 (H) 70 - 99 mg/dL   Comment 1 Notify RN  Comment 2 Document in Chart     Blood Alcohol level:  Lab Results  Component Value Date   ETH <10 02/16/2018    Metabolic Disorder Labs:  Lab Results  Component Value Date   HGBA1C 9.2 09/14/2015   No results found for: PROLACTIN No results found for: CHOL, TRIG, HDL, CHOLHDL, VLDL, LDLCALC  Current Medications: Current Facility-Administered Medications  Medication Dose Route Frequency Provider Last Rate Last Dose  . acetaminophen (TYLENOL) tablet 650 mg  650 mg Oral Q6H PRN Laveda Abbe, NP      . albuterol (PROVENTIL HFA;VENTOLIN HFA) 108 (90 Base) MCG/ACT inhaler 1-2 puff  1-2 puff Inhalation Q6H PRN Laveda Abbe, NP      . alum & mag hydroxide-simeth (MAALOX/MYLANTA) 200-200-20 MG/5ML suspension 30 mL  30 mL Oral Q4H PRN Laveda Abbe, NP      . hydrOXYzine (ATARAX/VISTARIL) tablet  25 mg  25 mg Oral TID PRN Laveda Abbe, NP   25 mg at 02/17/18 2232  . linagliptin (TRADJENTA) tablet 5 mg  5 mg Oral Daily Laveda Abbe, NP      . magnesium hydroxide (MILK OF MAGNESIA) suspension 30 mL  30 mL Oral Daily PRN Laveda Abbe, NP      . metFORMIN (GLUCOPHAGE) tablet 1,000 mg  1,000 mg Oral BID WC Laveda Abbe, NP      . nicotine (NICODERM CQ - dosed in mg/24 hours) patch 21 mg  21 mg Transdermal Daily Antonieta Pert, MD      . traZODone (DESYREL) tablet 50 mg  50 mg Oral QHS PRN Laveda Abbe, NP       PTA Medications: Medications Prior to Admission  Medication Sig Dispense Refill Last Dose  . albuterol (PROVENTIL HFA;VENTOLIN HFA) 108 (90 Base) MCG/ACT inhaler Inhale 1-2 puffs into the lungs every 6 (six) hours as needed for wheezing or shortness of breath.     Marland Kitchen glucose blood (ACCU-CHEK GUIDE) test strip Use as 3 times daily 100 each 2 05/12/2016 at Unknown time  . ibuprofen (ADVIL,MOTRIN) 800 MG tablet Take 1 tablet (800 mg total) by mouth 3 (three) times daily. 21 tablet 0 Past Month at Unknown time  . Insulin Glargine (LANTUS SOLOSTAR) 100 UNIT/ML Solostar Pen Inject 20 Units into the skin 2 (two) times daily. (Patient not taking: Reported on 02/16/2018) 1 pen 2 Not Taking at Unknown time  . Insulin Pen Needle (EXEL COMFORT POINT PEN NEEDLE) 31G X 4 MM MISC 1 application by Does not apply route daily. 90 each 1 05/12/2016 at Unknown time  . Lancets (ACCU-CHEK MULTICLIX) lancets Use as instructed 100 each 2 05/12/2016 at Unknown time  . sitaGLIPtin-metformin (JANUMET) 50-1000 MG tablet Take 1 tablet by mouth 2 (two) times daily with a meal. (Patient not taking: Reported on 02/16/2018) 180 tablet 0 Not Taking at Unknown time    Musculoskeletal: Strength & Muscle Tone: within normal limits Gait & Station: normal Patient leans: N/A  Psychiatric Specialty Exam: Physical Exam  Constitutional: She appears well-developed.  HENT:  Head:  Normocephalic.  Respiratory: Effort normal.  Neurological: She is alert.  Psychiatric: Her speech is normal and behavior is normal. Thought content normal. Cognition and memory are normal. She exhibits a depressed mood.    Review of Systems  Constitutional: Negative.   Skin: Negative.   Psychiatric/Behavioral: Positive for depression. Negative for hallucinations, memory loss and suicidal ideas. The patient is nervous/anxious. The patient does not have insomnia.  Blood pressure (!) 127/95, pulse (!) 112, temperature (!) 97.5 F (36.4 C), temperature source Oral, resp. rate 20, height 5\' 6"  (1.676 m), weight 120.7 kg.Body mass index is 42.93 kg/m.  General Appearance: Casual  Eye Contact:  Good  Speech:  Normal Rate  Volume:  Normal  Mood:  Depressed  Affect:  Congruent  Thought Process:  Goal Directed  Orientation:  Full (Time, Place, and Person)  Thought Content:  Logical  Suicidal Thoughts:  No  Homicidal Thoughts:  No  Memory:  Immediate;   Good Recent;   Good Remote;   Good  Judgement:  Fair  Insight:  Fair  Psychomotor Activity:  Normal  Concentration:  Concentration: Good and Attention Span: Good  Recall:  Good  Fund of Knowledge:  Good  Language:  Good  Akathisia:  NA  Handed:  Right  AIMS (if indicated):     Assets:  Communication Skills Desire for Improvement Housing Resilience Social Support Transportation  ADL's:  Intact  Cognition:  WNL  Sleep:  Number of Hours: 6.75    Treatment Plan Summary: Daily contact with patient to assess and evaluate symptoms and progress in treatment and Plan to continue therapy sessions and make a referral for outpatient therapy since she has declined medications at this time.  UDS negative, BAL-<10, no TSH obtained so will add this to lab. Patient declines medication at this time. Will continue to monitor every 15 minute checks.   Observation Level/Precautions:  Continuous Observation 15 minute checks  Laboratory:  TSH   Psychotherapy:  Individual and group therapy  Medications:  See above  Consultations:  Per need  Discharge Concerns:  Safety   Estimated LOS: 3-5 days   Other:     Physician Treatment Plan for Primary Diagnosis: MDD (major depressive disorder), single episode, severe , no psychosis (HCC) Long Term Goal(s): Improvement in symptoms so as ready for discharge  Short Term Goals: Ability to identify changes in lifestyle to reduce recurrence of condition will improve, Ability to disclose and discuss suicidal ideas, Ability to demonstrate self-control will improve and Ability to identify and develop effective coping behaviors will improve  Physician Treatment Plan for Secondary Diagnosis: Active Problems:   MDD (major depressive disorder), single episode, severe , no psychosis (HCC)  Long Term Goal(s): Improvement in symptoms so as ready for discharge  Short Term Goals: Ability to identify changes in lifestyle to reduce recurrence of condition will improve, Ability to disclose and discuss suicidal ideas, Ability to demonstrate self-control will improve and Compliance with prescribed medications will improve  I certify that inpatient services furnished can reasonably be expected to improve the patient's condition.    Maryagnes Amos, FNP 11/10/201911:30 AM

## 2018-02-18 NOTE — [facility] Group Notes (Signed)
[facility] LCSW Group Therapy Note  02/18/2018  9:00-10:00AM  Type of Therapy and Topic:  Group Therapy:  Adding Supports Including Being Your Own Support  Participation Level:  Active   Description of Group:  Patients in this group were introduced to the concept that additional supports including self-support are an essential part of recovery.  A song entitled "I Need Help!" was played and a group discussion was held in reaction to the idea of needing to add supports.  A song entitled "My Own Hero" was played and a group discussion ensued in which patients stated they could relate to the song and it inspired them to realize they have be willing to help themselves in order to succeed, because other people cannot achieve sobriety or stability for them.  We discussed adding a variety of healthy supports to address the various needs in their lives.  A song was played called "I Know Where I've Been" toward the end of group and used to conduct an inspirational wrap-up to group of remembering how far they have already come in their journey.  Therapeutic Goals: 1)  demonstrate the importance of being a part of one's own support system 2)  discuss reasons people in one's life may eventually be unable to be continually supportive  3)  identify the patient's current support system and   4)  elicit commitments to add healthy supports and to become more conscious of being self-supportive   Summary of Patient Progress:  The patient expressed that she has a few friends who are supportive.  She did not talk much at all during group, but remained for the whole time and listened attentively.   Therapeutic Modalities:   Motivational Interviewing Activity  Lynnell Chad

## 2018-02-18 NOTE — Progress Notes (Signed)
D.  Pt pleasant on approach, no complaints voiced.  Pt was positive for evening AA group, observed engaged in appropriate interaction with peers on the unit.  Pt denies SI/HI/AVH at this time.  A.  Support and encouragement offered, medication given as ordered  R.  Pt remains safe on the unit, will continue to monitor.   

## 2018-02-18 NOTE — [facility] Suicide Risk Assessment (Signed)
[facility] INPATIENT:  Family/Significant Other Suicide Prevention Education  Suicide Prevention Education:  Patient Refusal for Family/Significant Other Suicide Prevention Education: The patient Shelley Baldwin has refused to provide written consent for family/significant other to be provided Family/Significant Other Suicide Prevention Education during admission and/or prior to discharge.  Physician notified.  Evorn Gong 02/18/2018, 5:31 PM

## 2018-02-19 LAB — GLUCOSE, CAPILLARY
GLUCOSE-CAPILLARY: 100 mg/dL — AB (ref 70–99)
Glucose-Capillary: 94 mg/dL (ref 70–99)

## 2018-02-19 MED ORDER — ESCITALOPRAM OXALATE 5 MG PO TABS
5.0000 mg | ORAL_TABLET | Freq: Every day | ORAL | Status: DC
  Administered 2018-02-19: 5 mg via ORAL
  Filled 2018-02-19: qty 1
  Filled 2018-02-19: qty 7
  Filled 2018-02-19 (×2): qty 1

## 2018-02-19 MED ORDER — ESCITALOPRAM OXALATE 5 MG PO TABS: 5.0000 mg | ORAL_TABLET | Freq: Every day | ORAL | 0 refills | Status: AC

## 2018-02-19 MED ORDER — HYDROXYZINE HCL 25 MG PO TABS: 25.0000 mg | ORAL_TABLET | Freq: Three times a day (TID) | ORAL | 0 refills | Status: AC | PRN

## 2018-02-19 NOTE — Discharge Summary (Signed)
Physician Discharge Summary Note  Patient:  Shelley Baldwin is an 22 y.o., female MRN:  [id number] DOB:  1996-03-20 Patient phone:  [phone] (home)  Patient address:   [address] [place] [address],  Total Time spent with patient: 30 minutes  Date of Admission:  02/17/2018 Date of Discharge: 02/19/2018  Reason for Admission:  Depression, SI, alcohol abuse  Principal Problem: MDD (major depressive disorder), single episode, severe , no psychosis (HCC) Discharge Diagnoses: Patient Active Problem List   Diagnosis Date Noted  . MDD (major depressive disorder), single episode, severe , no psychosis (HCC) [F32.2] 02/17/2018  . Type 2 diabetes mellitus (HCC) [E11.9] 10/12/2015  . Elevated blood pressure [R03.0] 10/12/2015  . Morbid obesity (HCC) [E66.01] 10/12/2015  . IUD (intrauterine device) in place [Z97.5] 10/12/2015  . Tobacco abuse [Z72.0] 10/12/2015    Past Psychiatric History: see H&P  Past Medical History:  Past Medical History:  Diagnosis Date  . Diabetes mellitus without complication (HCC)   . Nasal turbinate hypertrophy 07/2013    Past Surgical History:  Procedure Laterality Date  . ADENOIDECTOMY    . TURBINATE REDUCTION     x 3, per mother  . TURBINATE REDUCTION Bilateral 08/05/2013   Procedure: BILATERAL TURBINATE REDUCTION;  Surgeon: Darletta Moll, MD;  Location: [facility];  Service: ENT;  Laterality: Bilateral;   Family History:  Family History  Problem Relation Age of Onset  . Diabetes Mother    Family Psychiatric  History: see H&P Social History:  Social History   Substance and Sexual Activity  Alcohol Use Yes   Comment: occasionally     Social History   Substance and Sexual Activity  Drug Use No    Social History   Socioeconomic History  . Marital status: Single    Spouse name: Not on file  . Number of children: Not on file  . Years of education: Not on file  . Highest education level: Not on file  Occupational  History  . Not on file  Social Needs  . Financial resource strain: Not on file  . Food insecurity:    Worry: Not on file    Inability: Not on file  . Transportation needs:    Medical: Not on file    Non-medical: Not on file  Tobacco Use  . Smoking status: Current Every Day Smoker    Packs/day: 0.50    Years: 2.00    Pack years: 1.00    Types: Cigarettes  . Smokeless tobacco: Never Used  . Tobacco comment: 5-6 cig./day  Substance and Sexual Activity  . Alcohol use: Yes    Comment: occasionally  . Drug use: No  . Sexual activity: Not on file  Lifestyle  . Physical activity:    Days per week: Not on file    Minutes per session: Not on file  . Stress: Not on file  Relationships  . Social connections:    Talks on phone: Not on file    Gets together: Not on file    Attends religious service: Not on file    Active member of club or organization: Not on file    Attends meetings of clubs or organizations: Not on file    Relationship status: Not on file  Other Topics Concern  . Not on file  Social History Narrative  . Not on file    Hospital Course:    History as per psychiatric intake: Patient is seen and examined.  Patient is a  22 year old female with essentially negative past psychiatric history who presented to the [facility] emergency department on 02/16/2018 with suicidal ideation.  The patient stated that she had been depressed for approximately 1 year.  She apparently had reached out to friends on the date of admission because she was experiencing recurrent suicidal thoughts of walking into traffic, cutting her wrist or other means of harming herself.  She stated she got into an argument with her boyfriend.  She stated these arguments had been taking place more frequently.  She apparently contacted her boyfriend sister who lived in the home with them and told her that "she is ready to end it all".  She was taken to the emergency room, and evaluated.  She admitted that  she had recent increased psychosocial stressors with the death of her grandmother, and also the death of several cats who had recently been delivered from the pregnant mother cat who was also killed.  She admitted to alcohol intake at least every other to every 3 days.  She stated she had drank enough alcohol over the last year to where she gained 20 to 30 pounds.  She does have a history of diabetes.  Review of the laboratories showed no TSH being obtained.  Her blood alcohol on admission was negative.  Her liver function enzymes were normal.  During the interview this morning she denied suicidal ideation, and was offered psychiatric medication but the patient declined at this point.  She denied any previous history of alcohol withdrawal symptoms.  She stated she had gone without alcohol for over 3 weeks within the last 12 months.  She denied any previous suicide attempts, psychiatric admissions, or psychiatric medications.  She does have a family history of alcohol abuse as well as schizophrenia.  She was admitted to the hospital for evaluation and stabilization.   As per evaluation today: Today upon evaluation, pt shares, "I'm doing good." Regarding her reasons for admission, pt shares, "Stress over time was building up to a major boiling point, and I had a fight with my boyfriend so I told my boyfriend's sister I was going to kill myself, so she brought me here." Pt explains that now she has had time to calm and have perspective about the situation that she no longer has any SI. She denies any specific concerns today. She is sleeping well. Her appetite is good. She denies other physical complaints. She denies SI/HI/AH/VH. She has been refusing medications for diabetes, and she was not started on any psychotropic medications upon admission. Discussed with patient about utility of taking an antidepressant to help with preventing severe depression and SI in the future, and pt was in agreement to trial of  lexapro. She was also in agreement to referral to outpatient medication management. She would like to discharge today, and she feels that she will be safe to discharge to outpatient level of care. She agrees to have follow up at [facility]. She was able to engage in safety planning including plan to return to [facility] or contact emergency services if she feels unable to maintain her own safety or the safety of others. Pt had no further questions, comments, or concerns.   The patient is at low risk of imminent suicide. Patient denied thoughts, intent, or plan for harm to self or others, expressed significant future orientation, and expressed an ability to mobilize assistance for her needs. She is presently void of any contributing psychiatric symptoms, cognitive difficulties, or substance use which would elevate  her risk for lethality. Chronic risk for lethality is elevated in light of poor social support, poor adherence, and impulsivity. The chronic risk is presently mitigated by her ongoing desire and engagement in [facility] treatment and mobilization of support from family and friends. Chronic risk may elevate if she experiences any significant loss or worsening of symptoms, which can be managed and monitored through outpatient providers. At this time, acute risk for lethality is low and she is stable for ongoing outpatient management.    Modifiable risk factors were addressed during this hospitalization through appropriate pharmacotherapy and establishment of outpatient follow-up treatment. Some risk factors for suicide are situational (i.e. Unstable social support) or related personality pathology (i.e. Poor coping mechanisms) and thus cannot be further mitigated by continued hospitalization in this setting.   Physical Findings: AIMS: Facial and Oral Movements Muscles of Facial Expression: None, normal Lips and Perioral Area: None, normal Jaw: None, normal Tongue: None, normal,Extremity Movements Upper (arms,  wrists, hands, fingers): None, normal Lower (legs, knees, ankles, toes): None, normal, Trunk Movements Neck, shoulders, hips: None, normal, Overall Severity Severity of abnormal movements (highest score from questions above): None, normal Incapacitation due to abnormal movements: None, normal Patient's awareness of abnormal movements (rate only patient's report): No Awareness, Dental Status Current problems with teeth and/or dentures?: No Does patient usually wear dentures?: No  CIWA:    COWS:     Musculoskeletal: Strength & Muscle Tone: within normal limits Gait & Station: normal Patient leans: N/A  Psychiatric Specialty Exam: Physical Exam  Nursing note and vitals reviewed.   Review of Systems  Constitutional: Negative for chills and fever.  Respiratory: Negative for cough and shortness of breath.   Cardiovascular: Negative for chest pain.  Gastrointestinal: Negative for abdominal pain, heartburn, nausea and vomiting.  Psychiatric/Behavioral: Negative for depression, hallucinations and suicidal ideas. The patient is not nervous/anxious and does not have insomnia.     Blood pressure 94/71, pulse 82, temperature 98.4 F (36.9 C), temperature source Oral, resp. rate 20, height 5\' 6"  (1.676 m), weight 120.7 kg.Body mass index is 42.93 kg/m.  General Appearance: Casual and Fairly Groomed  Eye Contact:  Good  Speech:  Clear and Coherent and Normal Rate  Volume:  Normal  Mood:  Euthymic  Affect:  Appropriate and Congruent  Thought Process:  Coherent and Goal Directed  Orientation:  Full (Time, Place, and Person)  Thought Content:  Logical  Suicidal Thoughts:  No  Homicidal Thoughts:  No  Memory:  Immediate;   Fair Recent;   Fair Remote;   Fair  Judgement:  Poor  Insight:  Lacking  Psychomotor Activity:  Normal  Concentration:  Concentration: Fair  Recall:  [employer] of Knowledge:  Fair  Language:  Fair  Akathisia:  No  Handed:    AIMS (if indicated):     Assets:   Resilience  ADL's:  Intact  Cognition:  WNL  Sleep:  Number of Hours: 5.5    Have you used any form of tobacco in the last 30 days? (Cigarettes, Smokeless Tobacco, Cigars, and/or Pipes): Yes  Has this patient used any form of tobacco in the last 30 days? (Cigarettes, Smokeless Tobacco, Cigars, and/or Pipes) Yes, Yes, A prescription for an FDA-approved tobacco cessation medication was offered at discharge and the patient refused  Blood Alcohol level:  Lab Results  Component Value Date   [facility] <10 02/16/2018    Metabolic Disorder Labs:  Lab Results  Component Value Date   HGBA1C 9.2 09/14/2015  No results found for: PROLACTIN No results found for: CHOL, TRIG, HDL, CHOLHDL, VLDL, LDLCALC  See Psychiatric Specialty Exam and Suicide Risk Assessment completed by Attending Physician prior to discharge.  Discharge destination:  Home  Is patient on multiple antipsychotic therapies at discharge:  No   Has Patient had three or more failed trials of antipsychotic monotherapy by history:  No  Recommended Plan for Multiple Antipsychotic Therapies: NA   Allergies as of 02/19/2018      Reactions   Milk-related Compounds    Whole milk      Medication List    STOP taking these medications   accu-chek multiclix lancets   glucose blood test strip   ibuprofen 800 MG tablet Commonly known as:  ADVIL,MOTRIN   Insulin Glargine 100 UNIT/ML Solostar Pen Commonly known as:  LANTUS   Insulin Pen Needle 31G X 4 MM Misc     TAKE these medications     Indication  albuterol 108 (90 Base) MCG/ACT inhaler Commonly known as:  PROVENTIL HFA;VENTOLIN HFA Inhale 1-2 puffs into the lungs every 6 (six) hours as needed for wheezing or shortness of breath.  Indication:  Asthma   escitalopram 5 MG tablet Commonly known as:  LEXAPRO Take 1 tablet (5 mg total) by mouth daily. Start taking on:  02/20/2018  Indication:  Major Depressive Disorder   hydrOXYzine 25 MG tablet Commonly known as:   ATARAX/VISTARIL Take 1 tablet (25 mg total) by mouth 3 (three) times daily as needed for anxiety.  Indication:  Feeling Anxious   sitaGLIPtin-metformin 50-1000 MG tablet Commonly known as:  JANUMET Take 1 tablet by mouth 2 (two) times daily with a meal.  Indication:  Type 2 Diabetes      Follow-up Information    Services, [facility] Follow up.   Why:  Scheduling office closed for holiday. Walk in to open access clinic within 3 days of hospital discharge to be assessed for mental health services including medication management and therapy. Walk in hours: Mon-Fri 9am-11am. Thank you.  Contact information: [address] [place] [address] [phone]           Follow-up recommendations:  Activity:  as tolerated Diet:  normal Tests:  NA Other:  see above for DC plan  Comments:    Signed: Micheal Likens, MD 02/19/2018, 10:29 AM

## 2018-02-19 NOTE — Progress Notes (Signed)
Patient ID: Shelley Baldwin, female   DOB: 03-22-1996, 22 y.o.   MRN: [id number]  D. Pt presents with a pelasant affect and cooperative behavior. Pt currently denies SI/HI and AVH and agrees to contact staff before acting on any harmful thoughts. Pt reports ongoing issues with homelessness and mother prior to admission. Pt states "I know now that I am worthy of love and need to take care of myself, I will try to go to live with a friend out of state." Pt requests to be discharged.   A. Labs and vitals monitored. Pt given and educated on medications. Pt supported emotionally and encouraged to express concerns and ask questions. MD and SW notified of patients request.   R. Pt remains safe with 15 minute checks. Pt currently denies SI/HI/AVH and agrees to contact staff if they occur. Pt refuses all medications. Will continue POC.

## 2018-02-19 NOTE — BHH Group Notes (Signed)
Pt attended spiritual care group on grief and loss facilitated by chaplain Kinlie Janice   Group goal of establishing open and affirming space for members to engage grief, normalize and support grief experience and provide psycho social education and grief support. Group opened with brief discussion and psycho-social ed around grief and loss in relationships and in relation to self - identifying life patterns, circumstances, changes connected to grief response. Group participated in facilitated process group around topic of grief.   

## 2018-02-19 NOTE — Progress Notes (Signed)
Patient ID: Shelley Baldwin, female   DOB: [birth year], 22 y.o.   MRN: [id number]    Pt given Lexapro 5 mg per MD verbal request. Pt educated on medication, verbal understanding expressed. Will monitor until discharge.

## 2018-02-19 NOTE — Progress Notes (Signed)
Recreation Therapy Notes  Date: 11.11.19 Time: 0930 Location: [address] Dayroom  Group Topic: Stress Management  Goal Area(s) Addresses:  Patient will verbalize importance of using healthy stress management.  Patient will identify positive emotions associated with healthy stress management.   Behavioral Response: Engaged  Intervention: Stress Management  Activity :  Progressive Muscle Relaxation.  LRT introduced the stress management technique of progressive muscle relaxation.  LRT read a script to lead patients through a series of exercises that allowed them to tense and then relax each muscle group individually.    Education:  Stress Management, Discharge Planning.   Education Outcome: Acknowledges edcuation/In group clarification offered/Needs additional education  Clinical Observations/Feedback: Pt attended and participated in group.    Caroll Rancher, LRT/CTRS         Lillia Abed, Yannet Rincon A 02/19/2018 10:56 AM

## 2018-02-19 NOTE — BHH Suicide Risk Assessment (Signed)
[facility] Discharge Suicide Risk Assessment   Principal Problem: MDD (major depressive disorder), single episode, severe , no psychosis (HCC) Discharge Diagnoses:  Patient Active Problem List   Diagnosis Date Noted  . MDD (major depressive disorder), single episode, severe , no psychosis (HCC) [F32.2] 02/17/2018  . Type 2 diabetes mellitus (HCC) [E11.9] 10/12/2015  . Elevated blood pressure [R03.0] 10/12/2015  . Morbid obesity (HCC) [E66.01] 10/12/2015  . IUD (intrauterine device) in place [Z97.5] 10/12/2015  . Tobacco abuse [Z72.0] 10/12/2015    Total Time spent with patient: 30 minutes   Psychiatric Specialty Exam:   Blood pressure 94/71, pulse 82, temperature 98.4 F (36.9 C), temperature source Oral, resp. rate 20, height 5\' 6"  (1.676 m), weight 120.7 kg.Body mass index is 42.93 kg/m.   Mental Status Per Nursing Assessment::   On Admission:  NA  Demographic Factors:  Adolescent or young adult and Low socioeconomic status  Loss Factors: Financial problems/change in socioeconomic status  Historical Factors: Family history of mental illness or substance abuse and Impulsivity  Risk Reduction Factors:   Living with another person, especially a relative, Positive social support, Positive therapeutic relationship and Positive coping skills or problem solving skills  Continued Clinical Symptoms:  Depression:   Comorbid alcohol abuse/dependence Impulsivity Alcohol/Substance Abuse/Dependencies  Cognitive Features That Contribute To Risk:  None    Suicide Risk:  Minimal: No identifiable suicidal ideation.  Patients presenting with no risk factors but with morbid ruminations; may be classified as minimal risk based on the severity of the depressive symptoms  Follow-up Information    [facility] Follow up.   Why:  Scheduling office closed for holiday. Walk in to open access clinic within 3 days of hospital discharge to be assessed for mental health services  including medication management and therapy. Walk in hours: Mon-Fri 9am-11am. Thank you.  Contact information: [address] [place] [address] [phone]           Plan Of Care/Follow-up recommendations:  Activity:  as tolerated Diet:  normal Tests:  NA Other:  see above for DC plan  Micheal Likens, MD 02/19/2018, 10:29 AM

## 2018-02-19 NOTE — Progress Notes (Signed)
  [facility] Adult Case Management Discharge Plan :  Will you be returning to the same living situation after discharge:  Yes,  home At discharge, do you have transportation home?: Yes,  family member Do you have the ability to pay for your medications: Yes,  Medicaid  Release of information consent forms completed and submitted to medical records by CSW.   Patient to Follow up at: Follow-up Information    [facility] Follow up.   Why:  Scheduling office closed for holiday. Walk in to open access clinic within 3 days of hospital discharge to be assessed for mental health services including medication management and therapy. Walk in hours: Mon-Fri 9am-11am. Thank you.  Contact information: [address] [place] [address] [phone]           Next level of care provider has access to [facility] Link:no  Safety Planning and Suicide Prevention discussed: Yes,  SPE completed with pt; pt declined to consent to collateral contact. SPI pamphlet and mobile crisis information provided to pt.   Have you used any form of tobacco in the last 30 days? (Cigarettes, Smokeless Tobacco, Cigars, and/or Pipes): Yes  Has patient been referred to the Quitline?: Patient refused referral  Patient has been referred for addiction treatment: Yes  Rona Ravens, LCSW 02/19/2018, 11:01 AM

## 2018-02-19 NOTE — BHH Group Notes (Signed)
Adult Psychoeducational Group Note  Date:  02/19/2018 Time:  9:22 AM  Group Topic/Focus:  Orientation:   The focus of this group is to educate the patient on the purpose and policies of crisis stabilization and provide a format to answer questions about their admission.  The group details unit policies and expectations of patients while admitted.  Participation Level:  Active  Participation Quality:  Appropriate  Affect:  Appropriate  Cognitive:  Alert  Insight: Appropriate  Engagement in Group:  Engaged  Modes of Intervention:  Orientation  Additional Comments:    Dellia Nims Pt attended orientation group facilitated by Lillie Columbia. 02/19/2018, 9:22 AM

## 2018-02-19 NOTE — Tx Team (Signed)
Interdisciplinary Treatment and Diagnostic Plan Update  02/19/2018 Time of Session: 1610RU Shelley Baldwin MRN: [id number]  Principal Diagnosis: MDD (major depressive disorder), single episode, severe , no psychosis (HCC)  Secondary Diagnoses: Principal Problem:   MDD (major depressive disorder), single episode, severe , no psychosis (HCC)   Current Medications:  Current Facility-Administered Medications  Medication Dose Route Frequency Provider Last Rate Last Dose  . acetaminophen (TYLENOL) tablet 650 mg  650 mg Oral Q6H PRN Laveda Abbe, NP      . albuterol (PROVENTIL HFA;VENTOLIN HFA) 108 (90 Base) MCG/ACT inhaler 1-2 puff  1-2 puff Inhalation Q6H PRN Laveda Abbe, NP      . alum & mag hydroxide-simeth (MAALOX/MYLANTA) 200-200-20 MG/5ML suspension 30 mL  30 mL Oral Q4H PRN Laveda Abbe, NP      . escitalopram (LEXAPRO) tablet 5 mg  5 mg Oral Daily Micheal Likens, MD   5 mg at 02/19/18 1022  . hydrOXYzine (ATARAX/VISTARIL) tablet 25 mg  25 mg Oral TID PRN Laveda Abbe, NP   25 mg at 02/18/18 2229  . linagliptin (TRADJENTA) tablet 5 mg  5 mg Oral Daily Laveda Abbe, NP      . magnesium hydroxide (MILK OF MAGNESIA) suspension 30 mL  30 mL Oral Daily PRN Laveda Abbe, NP      . metFORMIN (GLUCOPHAGE) tablet 1,000 mg  1,000 mg Oral BID WC Laveda Abbe, NP      . nicotine (NICODERM CQ - dosed in mg/24 hours) patch 21 mg  21 mg Transdermal Daily Antonieta Pert, MD      . traZODone (DESYREL) tablet 50 mg  50 mg Oral QHS PRN Laveda Abbe, NP       PTA Medications: Medications Prior to Admission  Medication Sig Dispense Refill Last Dose  . albuterol (PROVENTIL HFA;VENTOLIN HFA) 108 (90 Base) MCG/ACT inhaler Inhale 1-2 puffs into the lungs every 6 (six) hours as needed for wheezing or shortness of breath.     Marland Kitchen glucose blood (ACCU-CHEK GUIDE) test strip Use as 3 times daily 100 each 2 05/12/2016 at Unknown time   . ibuprofen (ADVIL,MOTRIN) 800 MG tablet Take 1 tablet (800 mg total) by mouth 3 (three) times daily. 21 tablet 0 Past Month at Unknown time  . Insulin Glargine (LANTUS SOLOSTAR) 100 UNIT/ML Solostar Pen Inject 20 Units into the skin 2 (two) times daily. (Patient not taking: Reported on 02/16/2018) 1 pen 2 Not Taking at Unknown time  . Insulin Pen Needle (EXEL COMFORT POINT PEN NEEDLE) 31G X 4 MM MISC 1 application by Does not apply route daily. 90 each 1 05/12/2016 at Unknown time  . Lancets (ACCU-CHEK MULTICLIX) lancets Use as instructed 100 each 2 05/12/2016 at Unknown time  . sitaGLIPtin-metformin (JANUMET) 50-1000 MG tablet Take 1 tablet by mouth 2 (two) times daily with a meal. (Patient not taking: Reported on 02/16/2018) 180 tablet 0 Not Taking at Unknown time    Patient Stressors: Financial difficulties Marital or family conflict Other: various deaths past 6 weeks  Patient Strengths: Ability for insight Active sense of humor Average or above average intelligence Capable of independent living [employer] of knowledge Motivation for treatment/growth  Treatment Modalities: Medication Management, Group therapy, Case management,  1 to 1 session with clinician, Psychoeducation, Recreational therapy.   Physician Treatment Plan for Primary Diagnosis: MDD (major depressive disorder), single episode, severe , no psychosis (HCC) Long Term Goal(s): Improvement in symptoms so as ready for discharge  Improvement in symptoms so as ready for discharge   Short Term Goals: Ability to identify changes in lifestyle to reduce recurrence of condition will improve Ability to disclose and discuss suicidal ideas Ability to demonstrate self-control will improve Ability to identify and develop effective coping behaviors will improve Ability to identify changes in lifestyle to reduce recurrence of condition will improve Ability to disclose and discuss suicidal ideas Ability to demonstrate self-control will  improve Compliance with prescribed medications will improve  Medication Management: Evaluate patient's response, side effects, and tolerance of medication regimen.  Therapeutic Interventions: 1 to 1 sessions, Unit Group sessions and Medication administration.  Evaluation of Outcomes: Adequate for Discharge  Physician Treatment Plan for Secondary Diagnosis: Principal Problem:   MDD (major depressive disorder), single episode, severe , no psychosis (HCC)  Long Term Goal(s): Improvement in symptoms so as ready for discharge Improvement in symptoms so as ready for discharge   Short Term Goals: Ability to identify changes in lifestyle to reduce recurrence of condition will improve Ability to disclose and discuss suicidal ideas Ability to demonstrate self-control will improve Ability to identify and develop effective coping behaviors will improve Ability to identify changes in lifestyle to reduce recurrence of condition will improve Ability to disclose and discuss suicidal ideas Ability to demonstrate self-control will improve Compliance with prescribed medications will improve     Medication Management: Evaluate patient's response, side effects, and tolerance of medication regimen.  Therapeutic Interventions: 1 to 1 sessions, Unit Group sessions and Medication administration.  Evaluation of Outcomes: Adequate for Discharge   RN Treatment Plan for Primary Diagnosis: MDD (major depressive disorder), single episode, severe , no psychosis (HCC) Long Term Goal(s): Knowledge of disease and therapeutic regimen to maintain health will improve  Short Term Goals: Ability to remain free from injury will improve, Ability to demonstrate self-control, Ability to disclose and discuss suicidal ideas and Ability to identify and develop effective coping behaviors will improve  Medication Management: RN will administer medications as ordered by provider, will assess and evaluate patient's response and  provide education to patient for prescribed medication. RN will report any adverse and/or side effects to prescribing provider.  Therapeutic Interventions: 1 on 1 counseling sessions, Psychoeducation, Medication administration, Evaluate responses to treatment, Monitor vital signs and CBGs as ordered, Perform/monitor CIWA, COWS, AIMS and Fall Risk screenings as ordered, Perform wound care treatments as ordered.  Evaluation of Outcomes: Adequate for Discharge   LCSW Treatment Plan for Primary Diagnosis: MDD (major depressive disorder), single episode, severe , no psychosis (HCC) Long Term Goal(s): Safe transition to appropriate next level of care at discharge, Engage patient in therapeutic group addressing interpersonal concerns.  Short Term Goals: Engage patient in aftercare planning with referrals and resources, Facilitate patient progression through stages of change regarding substance use diagnoses and concerns and Identify triggers associated with mental health/substance abuse issues  Therapeutic Interventions: Assess for all discharge needs, 1 to 1 time with Social worker, Explore available resources and support systems, Assess for adequacy in community support network, Educate family and significant other(s) on suicide prevention, Complete Psychosocial Assessment, Interpersonal group therapy.  Evaluation of Outcomes: Adequate for Discharge   Progress in Treatment: Attending groups: Yes. Participating in groups: Yes. Taking medication as prescribed: Yes. Toleration medication: Yes. Family/Significant other contact made: SPE completed with pt; pt declined to consent to collateral contact.  Patient understands diagnosis: Yes. Discussing patient identified problems/goals with staff: Yes. Medical problems stabilized or resolved: Yes. Denies suicidal/homicidal ideation: Yes, per self report.  Issues/concerns per patient self-inventory: No. Other: n/a   New problem(s) identified: No,  Describe:  n/a  New Short Term/Long Term Goal(s): detox, medication management for mood stabilization; elimination of SI thoughts; development of comprehensive mental wellness/sobriety plan.   Patient Goals:  "to get connected to a therapist."   Discharge Plan or Barriers: Pt plans to return home. Agreeable to follow-up at [facility] for medication management and therapy. MHAG pamphlet, Mobile Crisis information, and AA/NA information provided to patient for additional community support and resources.   Reason for Continuation of Hospitalization: none  Estimated Length of Stay: Monday, 02/19/18 Attendees: Patient: 02/19/2018 11:02 AM  Physician: Dr. Altamese [place] MD 02/19/2018 11:02 AM  Nursing: Arlyss Repress RN; Huntley Dec RN 02/19/2018 11:02 AM  RN Care Manager:x 02/19/2018 11:02 AM  Social Worker: Corrie Mckusick LCSW 02/19/2018 11:02 AM  Recreational Therapist: x 02/19/2018 11:02 AM  Other: Armandina Stammer NP; Hillery Jacks NP 02/19/2018 11:02 AM  Other:  02/19/2018 11:02 AM  Other: 02/19/2018 11:02 AM    Scribe for Treatment Team: Rona Ravens, LCSW 02/19/2018 11:02 AM

## 2018-02-19 NOTE — Progress Notes (Signed)
Patient ID: Shelley Baldwin, female   DOB: June 24, 1995, 22 y.o.   MRN: [id number]  Pt discharged to lobby. Pt was stable and appreciative at that time. All papers and prescriptions were given and valuables returned. Verbal understanding expressed. Denies SI/HI and A/VH. Pt given opportunity to express concerns and ask questions.

## 2020-04-29 ENCOUNTER — Ambulatory Visit (HOSPITAL_COMMUNITY)
Admission: EM | Admit: 2020-04-29 | Discharge: 2020-04-29 | Disposition: A | Discharge status: Home / Self Care | Payer: Medicaid Other

## 2021-07-03 ENCOUNTER — Encounter (HOSPITAL_BASED_OUTPATIENT_CLINIC_OR_DEPARTMENT_OTHER): Payer: Self-pay | Admitting: *Deleted

## 2021-07-03 ENCOUNTER — Other Ambulatory Visit: Payer: Self-pay

## 2021-07-03 DIAGNOSIS — Z5321 Procedure and treatment not carried out due to patient leaving prior to being seen by health care provider: Secondary | ICD-10-CM | POA: Insufficient documentation

## 2021-07-03 DIAGNOSIS — H5789 Other specified disorders of eye and adnexa: Secondary | ICD-10-CM | POA: Insufficient documentation

## 2021-07-03 DIAGNOSIS — R067 Sneezing: Secondary | ICD-10-CM | POA: Diagnosis present

## 2021-07-03 NOTE — ED Triage Notes (Signed)
Pt states sneezing for two weeks and then last week started having itching, watery, red eyes (bilateral, L>R).  ?

## 2021-07-04 ENCOUNTER — Emergency Department (HOSPITAL_BASED_OUTPATIENT_CLINIC_OR_DEPARTMENT_OTHER)
Admission: EM | Admit: 2021-07-04 | Discharge: 2021-07-04 | Disposition: A | Discharge status: Home / Self Care | Payer: BC Managed Care – PPO | Attending: Emergency Medicine | Admitting: Emergency Medicine

## 2021-07-07 ENCOUNTER — Encounter (HOSPITAL_BASED_OUTPATIENT_CLINIC_OR_DEPARTMENT_OTHER): Payer: Self-pay

## 2021-07-07 ENCOUNTER — Other Ambulatory Visit: Payer: Self-pay

## 2021-07-07 ENCOUNTER — Emergency Department (HOSPITAL_BASED_OUTPATIENT_CLINIC_OR_DEPARTMENT_OTHER): Payer: BC Managed Care – PPO | Admitting: Radiology

## 2021-07-07 ENCOUNTER — Emergency Department (HOSPITAL_BASED_OUTPATIENT_CLINIC_OR_DEPARTMENT_OTHER)
Admission: EM | Admit: 2021-07-07 | Discharge: 2021-07-07 | Disposition: A | Discharge status: Home / Self Care | Payer: BC Managed Care – PPO | Attending: Emergency Medicine | Admitting: Emergency Medicine

## 2021-07-07 DIAGNOSIS — E119 Type 2 diabetes mellitus without complications: Secondary | ICD-10-CM | POA: Insufficient documentation

## 2021-07-07 DIAGNOSIS — Z7984 Long term (current) use of oral hypoglycemic drugs: Secondary | ICD-10-CM | POA: Insufficient documentation

## 2021-07-07 DIAGNOSIS — S92352A Displaced fracture of fifth metatarsal bone, left foot, initial encounter for closed fracture: Secondary | ICD-10-CM | POA: Diagnosis not present

## 2021-07-07 DIAGNOSIS — W108XXA Fall (on) (from) other stairs and steps, initial encounter: Secondary | ICD-10-CM | POA: Diagnosis not present

## 2021-07-07 DIAGNOSIS — M79672 Pain in left foot: Secondary | ICD-10-CM | POA: Diagnosis not present

## 2021-07-07 DIAGNOSIS — H109 Unspecified conjunctivitis: Secondary | ICD-10-CM

## 2021-07-07 DIAGNOSIS — S99922A Unspecified injury of left foot, initial encounter: Secondary | ICD-10-CM | POA: Diagnosis present

## 2021-07-07 MED ORDER — OXYCODONE-ACETAMINOPHEN 5-325 MG PO TABS: 1.0000 | ORAL_TABLET | Freq: Four times a day (QID) | ORAL | 0 refills | Status: AC | PRN

## 2021-07-07 MED ORDER — POLYMYXIN B-TRIMETHOPRIM 10000-0.1 UNIT/ML-% OP SOLN: 1.0000 [drp] | OPHTHALMIC | 0 refills | Status: AC

## 2021-07-07 NOTE — ED Triage Notes (Signed)
Patient here POV from Home with Eye Drainage. ? ?Patient endorses Eye Drainage for approximately 8 days (Left has more Drainage than Right). ? ?Patient states she may have injured her Left Foot Monday when she missed a step going downstairs.  ? ?No Acute Deformity noted. Pulse Palpable.  ? ?NAD Noted during Triage. A&Ox4. GCS 15. BIB Wheelchair.  ?

## 2021-07-07 NOTE — Discharge Instructions (Addendum)
It was a pleasure taking care of you today! ? ?You will be sent a prescription for polytrim eyedrops for your eyes to treat the pinkeye.  Hand hygiene is also very important.  I recommend if possible removing your eyelashes and not getting them refilled until you have finished the antibiotic drops.  You may follow with your primary care provider as needed.  Return to the emergency department for experiencing increasing/worsening eye pain, redness, vision changes, worsening symptoms. ? ?It was a pleasure taking care of you today!  ? ?Your x-ray showed fracture of your left foot.  Attached is information for the on-call Orthopedist, Dr. Ophelia Charter. It is important that you call the orthopedist and inform them that you were seen in the ED to set up a follow-up appointment.  Wear the cam boot during the day, you may weight-bear as tolerated.  You may remove the cam boot at night.  You are prescribed Percocet, take as prescribed.  Do not operate any heavy machinery or drive while taking this medication. You may take over the counter 600 mg ibuprofen every 6 hours or for no more than 7 days. You may apply ice to the affected area for up to 15 minutes at a time.  Ensure to place a barrier between your skin and the ice.  Return to the Emergency Department if you are experiencing increasing/worsening swelling, bruising, pain, or worsening symptoms. ?

## 2021-07-07 NOTE — ED Provider Notes (Signed)
?[facility] EM[facility] DEPT ?Provider Note ? ? ?CSN: [id number] ?Arrival date & time: 07/07/21  1102 ? ?  ? ?History ? ?Chief Complaint  ?Patient presents with  ? Eye Drainage  ? Foot Pain  ? ? ?Shelley Baldwin is a 26 y.o. female who presents to the Emergency Department complaining of bilateral eye drainage (yellow) onset 8 days.  She noticed that her left eye has had more drainage than the right.  She does not wear contacts or glasses.  She notes that she is supposed to wear glasses.  She does not have an eye doctor at this time. Patient has associated eye redness, eye crusting. Tried claritin without relief of her symptoms. Denies fever, chills. Denies allergies to medications. ? ? ?Patient also complains of left foot pain onset 3 days ago.  She notes that she was coming down the stairs and had a mechanical fall which caused her to injure her foot.  She has not tried any medications for her symptoms.  Patient has associated left foot swelling.  Denies wound or color change. ? ? ? ?The history is provided by the patient. No language interpreter was used.  ? ?  ? ?Home Medications ?Prior to Admission medications   ?Medication Sig Start Date End Date Taking? Authorizing Provider  ?oxyCODONE-acetaminophen (PERCOCET/ROXICET) 5-325 MG tablet Take 1 tablet by mouth every 6 (six) hours as needed for severe pain. 07/07/21  Yes Rudie Sermons A, PA-C  ?trimethoprim-polymyxin b (POLYTRIM) ophthalmic solution Place 1 drop into both eyes every 4 (four) hours. 07/07/21  Yes Dorris Pierre A, PA-C  ?albuterol (PROVENTIL HFA;VENTOLIN HFA) 108 (90 Base) MCG/ACT inhaler Inhale 1-2 puffs into the lungs every 6 (six) hours as needed for wheezing or shortness of breath.    [provider]  ?escitalopram (LEXAPRO) 5 MG tablet Take 1 tablet (5 mg total) by mouth daily. 02/20/18   Pennelope Bracken, MD  ?hydrOXYzine (ATARAX/VISTARIL) 25 MG tablet Take 1 tablet (25 mg total) by mouth 3 (three) times daily as needed  for anxiety. 02/19/18   Pennelope Bracken, MD  ?sitaGLIPtin-metformin (JANUMET) 50-1000 MG tablet Take 1 tablet by mouth 2 (two) times daily with a meal. ?Patient not taking: Reported on 02/16/2018 01/15/16   Dettinger, Fransisca Kaufmann, MD  ?   ? ?Allergies    ?Milk-related compounds   ? ?Review of Systems   ?Review of Systems  ?Constitutional:  Negative for chills and fever.  ?Eyes:  Positive for pain, discharge and redness. Negative for itching.  ?Musculoskeletal:  Positive for arthralgias and joint swelling.  ?Skin:  Negative for color change and wound.  ?All other systems reviewed and are negative. ? ?Physical Exam ?Updated Vital Signs ?BP (!) 158/110 (BP Location: Right Arm)   Pulse 100   Temp 98.4 ?F (36.9 ?C)   Resp 16   Ht 5\' 8"  (1.727 m)   Wt 122.5 kg   SpO2 100%   BMI 41.06 kg/m?  ?Physical Exam ?Vitals and nursing note reviewed.  ?Constitutional:   ?   General: She is not in acute distress. ?   Appearance: Normal appearance. She is not ill-appearing.  ?HENT:  ?   Head: Normocephalic and atraumatic.  ?   Nose: Nose normal. No congestion or rhinorrhea.  ?   Mouth/Throat:  ?   Mouth: Mucous membranes are moist.  ?   Pharynx: Oropharynx is clear. No oropharyngeal exudate or posterior oropharyngeal erythema.  ?Eyes:  ?   General: Lids are everted, no foreign  bodies appreciated. Vision grossly intact. No visual field deficit or scleral icterus.    ?   Right eye: No foreign body or discharge.     ?   Left eye: No foreign body or discharge.  ?   Extraocular Movements: Extraocular movements intact.  ?   Conjunctiva/sclera: Conjunctivae normal.  ?   Pupils: Pupils are equal, round, and reactive to light.  ?   Comments: EOMI. PERRL.  Crusting noted to bilateral lashes. Mild redness noted to bilateral sclera.  ?Cardiovascular:  ?   Rate and Rhythm: Normal rate.  ?Pulmonary:  ?   Effort: Pulmonary effort is normal. No respiratory distress.  ?Musculoskeletal:     ?   General: Normal range of motion.  ?   Cervical  back: Neck supple.  ?   Comments: Mild tenderness to palpation to base of fifth metatarsal with mild swelling appreciated.  No obvious erythema or ecchymosis noted.  Full active range of motion of the left ankle.  Pedal pulses intact bilaterally.  No tenderness to palpation noted to left lower extremity.  ?Skin: ?   General: Skin is warm and dry.  ?   Findings: No bruising, erythema or rash.  ?Neurological:  ?   Mental Status: She is alert.  ?Psychiatric:     ?   Behavior: Behavior normal.  ? ? ?ED Results / Procedures / Treatments   ?Labs ?(all labs ordered are listed, but only abnormal results are displayed) ?Labs Reviewed - No data to display ? ?EKG ?None ? ?Radiology ?DG Foot Complete Left ? ?Result Date: 07/07/2021 ?CLINICAL DATA:  Left foot pain after injury. EXAM: LEFT FOOT - COMPLETE 3+ VIEW COMPARISON:  None. FINDINGS: Mildly displaced fracture is seen involving the proximal base of the fifth metatarsal. No other bony abnormality is noted. Joint spaces are intact. IMPRESSION: Mildly displaced proximal fifth metatarsal fracture. Electronically Signed   By: Marijo Conception M.D.   On: 07/07/2021 11:51   ? ?Procedures ?Procedures  ? ? ?Medications Ordered in ED ?Medications - No data to display ? ?ED Course/ Medical Decision Making/ A&P ?Clinical Course as of 07/07/21 1334  ?Wed Jul 07, 2021  ?1258 Consult with Orthopedist, Dr. Lorin Mercy who recommends splinting and follow up in the office. [SB]  ?1311 Discussed discharge treatment plan with patient at bedside. Pt agreeable at this time. Pt appears safe for discharge.  [SB]  ?  ?Clinical Course User Index ?[SB] Deakin Lacek A, PA-C  ? ?                        ?Medical Decision Making ?Amount and/or Complexity of Data Reviewed ?Radiology: ordered. ? ?Risk ?Prescription drug management. ? ? ?Patient with left foot pain onset 3 days ago status post mechanical fall causing her to injure her left foot. Vital signs stable, patient afebrile. On exam, patient with mild  tenderness to palpation to base of fifth metatarsal with mild swelling appreciated.  No obvious erythema, ecchymosis, deformity.  Full active range of motion of left ankle.  Pedal pulses intact bilaterally.  Differential diagnosis includes fracture, dislocation, contusion. ? ?Patient presents with bilateral eye drainage and crusting (yellow,).  Denies wearing glasses or contacts.  Patient notes she is supposed to wear glasses however does not.  Denies vision changes. On exam, patient with EOMI, PERRL.  Crusting noted to bilateral lashes.  Mild redness noted to bilateral sclera.  Conjunctival injection noted bilaterally.  Differential diagnosis includes bacterial conjunctivitis, viral  conjunctivitis, allergic conjunctivitis.  Visual acuity without acute abnormalities. ? ?Imaging: ?I ordered imaging studies including left foot xray ?I independently visualized and interpreted imaging which showed: Mildly displaced left proximal metatarsal ?I agree with the radiologist interpretation ? ?Consultations: ?I requested consultation with the Orthopedist, Dr. Lorin Mercy and discussed lab and imaging findings as well as pertinent plan - they recommend: Boot and follow-up in the office ? ? ?Disposition: ?Patient presentation suspicious for bacterial conjunctivitis.  Less likely corneal abrasion, patient without foreign body sensation.  Less likely allergic or viral conjunctivitis at this time.  I feel that the patient would benefit from discharge home.  Polytrim prescription eyedrops sent. Patient presentation also suspicious for fracture of proximal left fifth metatarsal.  Doubt contusion, dislocation, strain at this time.  Patient will be discharged home with a short course of Percocet.  Also provided with a cam boot in the ED.  Provided with information for the on-call orthopedist to set up a follow-up appointment regarding today's ED visit.  Supportive care measures and strict return precautions discussed with patient at bedside.  Pt acknowledges and verbalizes understanding. Pt appears safe for discharge. Follow up as indicated in discharge paperwork.  ? ?This chart was dictated using voice recognition software, Dragon. Despite the bes

## 2021-08-28 ENCOUNTER — Other Ambulatory Visit: Payer: Self-pay

## 2021-08-28 ENCOUNTER — Emergency Department (HOSPITAL_BASED_OUTPATIENT_CLINIC_OR_DEPARTMENT_OTHER)
Admission: EM | Admit: 2021-08-28 | Discharge: 2021-08-28 | Discharge status: Against Medical Advice | Payer: BC Managed Care – PPO | Attending: Emergency Medicine | Admitting: Emergency Medicine

## 2021-08-28 ENCOUNTER — Encounter (HOSPITAL_BASED_OUTPATIENT_CLINIC_OR_DEPARTMENT_OTHER): Payer: Self-pay

## 2021-08-28 ENCOUNTER — Emergency Department (HOSPITAL_BASED_OUTPATIENT_CLINIC_OR_DEPARTMENT_OTHER): Payer: BC Managed Care – PPO | Admitting: Radiology

## 2021-08-28 DIAGNOSIS — Z7951 Long term (current) use of inhaled steroids: Secondary | ICD-10-CM | POA: Diagnosis not present

## 2021-08-28 DIAGNOSIS — R0789 Other chest pain: Secondary | ICD-10-CM | POA: Insufficient documentation

## 2021-08-28 DIAGNOSIS — R0602 Shortness of breath: Secondary | ICD-10-CM

## 2021-08-28 LAB — BASIC METABOLIC PANEL
Anion gap: 15 (ref 5–15)
BUN: <5 mg/dL — ABNORMAL LOW (ref 6–20)
CO2: 24 mmol/L (ref 22–32)
Calcium: 8.9 mg/dL (ref 8.9–10.3)
Chloride: 100 mmol/L (ref 98–111)
Creatinine, Ser: 0.68 mg/dL (ref 0.44–1.00)
GFR, Estimated: >60 mL/min (ref >60)
Glucose, Bld: 110 mg/dL — ABNORMAL HIGH (ref 70–99)
Potassium: 3.7 mmol/L (ref 3.5–5.1)
Sodium: 139 mmol/L (ref 135–145)

## 2021-08-28 LAB — CBC
HCT: 41.9 % (ref 36.0–46.0)
Hemoglobin: 14.3 g/dL (ref 12.0–15.0)
MCH: 32.6 pg (ref 26.0–34.0)
MCHC: 34.1 g/dL (ref 30.0–36.0)
MCV: 95.7 fL (ref 80.0–100.0)
Platelets: 203 10*3/uL (ref 150–400)
RBC: 4.38 MIL/uL (ref 3.87–5.11)
RDW: 13.1 % (ref 11.5–15.5)
WBC: 4 10*3/uL (ref 4.0–10.5)
nRBC: 0 % (ref 0.0–0.2)

## 2021-08-28 LAB — HCG, SERUM, QUALITATIVE: Preg, Serum: NEGATIVE

## 2021-08-28 LAB — TROPONIN I (HIGH SENSITIVITY)
Troponin I (High Sensitivity): 3 ng/L (ref <18)
Troponin I (High Sensitivity): 2 ng/L (ref <18)

## 2021-08-28 MED ORDER — ONDANSETRON HCL 4 MG/2ML IJ SOLN
4.0000 mg | Freq: Once | INTRAMUSCULAR | Status: AC
  Administered 2021-08-28: 4 mg via INTRAVENOUS

## 2021-08-28 NOTE — ED Provider Notes (Signed)
[facility] EMERGENCY DEPT Provider Note   CSN: [id number] Arrival date & time: 08/28/21  1254     History  Chief Complaint  Patient presents with   Shortness of Breath    Shelley Baldwin is a 26 y.o. female who presents to the ED with concerns for shortness of breath onset PTA.  Patient notes she had increased stressors at that time.  Denies history of anxiety.  Had chest tightness at the time, resolved nausea, vomiting x1 episode.  Denies past medical history of asthma or COPD. Denies fever, chills, cough.  The history is provided by the patient. No language interpreter was used.      Home Medications Prior to Admission medications   Medication Sig Start Date End Date Taking? Authorizing Provider  albuterol (PROVENTIL HFA;VENTOLIN HFA) 108 (90 Base) MCG/ACT inhaler Inhale 1-2 puffs into the lungs every 6 (six) hours as needed for wheezing or shortness of breath.    [provider]  escitalopram (LEXAPRO) 5 MG tablet Take 1 tablet (5 mg total) by mouth daily. 02/20/18   Micheal Likens, MD  hydrOXYzine (ATARAX/VISTARIL) 25 MG tablet Take 1 tablet (25 mg total) by mouth 3 (three) times daily as needed for anxiety. 02/19/18   Micheal Likens, MD  oxyCODONE-acetaminophen (PERCOCET/ROXICET) 5-325 MG tablet Take 1 tablet by mouth every 6 (six) hours as needed for severe pain. 07/07/21   Aasim Restivo A, PA-C  sitaGLIPtin-metformin (JANUMET) 50-1000 MG tablet Take 1 tablet by mouth 2 (two) times daily with a meal. Patient not taking: Reported on 02/16/2018 01/15/16   Dettinger, Elige Radon, MD  trimethoprim-polymyxin b (POLYTRIM) ophthalmic solution Place 1 drop into both eyes every 4 (four) hours. 07/07/21   Nazar Kuan A, PA-C      Allergies    Milk-related compounds    Review of Systems   Review of Systems  Constitutional:  Negative for chills and fever.  Respiratory:  Positive for chest tightness and shortness of breath. Negative for cough.    Gastrointestinal:  Positive for nausea and vomiting.  All other systems reviewed and are negative.  Physical Exam Updated Vital Signs BP (!) 137/109   Pulse 83   Temp 98.8 F (37.1 C)   Resp 13   Ht 5\' 8"  (1.727 m)   Wt 122.5 kg   SpO2 98%   BMI 41.06 kg/m  Physical Exam Vitals and nursing note reviewed.  Constitutional:      General: She is not in acute distress.    Appearance: She is not diaphoretic.  HENT:     Head: Normocephalic and atraumatic.     Mouth/Throat:     Pharynx: No oropharyngeal exudate.  Eyes:     General: No scleral icterus.    Conjunctiva/sclera: Conjunctivae normal.  Cardiovascular:     Rate and Rhythm: Normal rate and regular rhythm.     Pulses: Normal pulses.     Heart sounds: Normal heart sounds.  Pulmonary:     Effort: Pulmonary effort is normal. No respiratory distress.     Breath sounds: Normal breath sounds. No wheezing.  Abdominal:     General: Bowel sounds are normal.     Palpations: Abdomen is soft. There is no mass.     Tenderness: There is no abdominal tenderness. There is no guarding or rebound.  Musculoskeletal:        General: Normal range of motion.     Cervical back: Normal range of motion and neck supple.  Skin:  General: Skin is warm and dry.  Neurological:     Mental Status: She is alert.  Psychiatric:        Behavior: Behavior normal.    ED Results / Procedures / Treatments   Labs (all labs ordered are listed, but only abnormal results are displayed) Labs Reviewed  BASIC METABOLIC PANEL - Abnormal; Notable for the following components:      Result Value   Glucose, Bld 110 (*)    BUN <5 (*)    All other components within normal limits  CBC  HCG, SERUM, QUALITATIVE  TROPONIN I (HIGH SENSITIVITY)  TROPONIN I (HIGH SENSITIVITY)    EKG None  Radiology DG Chest 2 View  Result Date: 08/28/2021 CLINICAL DATA:  Acute onset chest pain and shortness of breath today. EXAM: CHEST - 2 VIEW COMPARISON:  01/02/2018  FINDINGS: The heart size and mediastinal contours are within normal limits. Both lungs are clear. The visualized skeletal structures are unremarkable. IMPRESSION: No active cardiopulmonary disease. Electronically Signed   By: Danae Orleans M.D.   On: 08/28/2021 14:02    Procedures Procedures    Medications Ordered in ED Medications  ondansetron (ZOFRAN) injection 4 mg (4 mg Intravenous Given 08/28/21 1320)    ED Course/ Medical Decision Making/ A&P Clinical Course as of 08/28/21 1803  Sat Aug 28, 2021  1530 Notified that patient left AMA, patient would've been discharged from the ED prior to leaving AMA. [SB]    Clinical Course User Index [SB] Havish Petties A, PA-C                           Medical Decision Making Amount and/or Complexity of Data Reviewed Labs: ordered. Radiology: ordered.  Risk Prescription drug management.   Patient presents to the ED with shortness of breath and chest tightness onset prior to arrival.  Patient notes she had increased stressors prior to the episode.  Vital signs stable, patient not tachycardic or hypoxic.  On exam patient without acute cardiovascular, respiratory, abdominal exam findings. Differential diagnosis includes ACS, aortic dissection, pneumothorax, PNA, anxiety.    EKG:  I personally interpreted EKG.  Pertinent findings include sinus tachycardia..  Labs:  I ordered, and personally interpreted labs.  The pertinent results include:   Initial troponin 3, delta troponin at 2. hCG negative CBC unremarkable BMP unremarkable  Imaging: I ordered imaging studies including CXR I independently visualized and interpreted imaging which showed: No acute cardiopulmonary findings I agree with the radiologist interpretation  Medications:  I ordered medication including Zofran for symptom management Reevaluation of the patient after these medicines and interventions, I reevaluated the patient and found that they have improved I have reviewed  the patients home medicines and have made adjustments as needed  Disposition: Presentation suspicious for anxiety as cause of symptoms. EKG without acute ST/T changes, troponins negative, chest x-ray negative, low suspicion for ACS at this time. Chest x-ray without acute findings, vital signs stable, doubt aortic dissection or pneumothorax at this time. Heart score, patient at low risk.  Notified that patient left AMA prior to repeat troponin.  Patient would have been discharged home since her repeat troponin was at 2 and improved from previous value.    This chart was dictated using voice recognition software, Dragon. Despite the best efforts of this provider to proofread and correct errors, errors may still occur which can change documentation meaning.  Final Clinical Impression(s) / ED Diagnoses Final diagnoses:  Shortness of  breath    Rx / DC Orders ED Discharge Orders     None         Aydee Mcnew A, PA-C 08/28/21 1803    Milagros Lollykstra, Richard S, MD 08/29/21 1014

## 2021-08-28 NOTE — ED Triage Notes (Signed)
Patient here POV from Home with SOB.  Endorses SOB today at 1240 with Sudden Acute Onset.  Per Patient, No History of Asthma or Respiratory-Related Problems.   Anxious and Tearful in Triage. A&Ox4. GCS 15. BIB Wheelchair.

## 2022-03-11 DIAGNOSIS — Z419 Encounter for procedure for purposes other than remedying health state, unspecified: Secondary | ICD-10-CM | POA: Diagnosis not present

## 2022-04-11 DIAGNOSIS — Z419 Encounter for procedure for purposes other than remedying health state, unspecified: Secondary | ICD-10-CM | POA: Diagnosis not present

## 2022-05-12 DIAGNOSIS — Z419 Encounter for procedure for purposes other than remedying health state, unspecified: Secondary | ICD-10-CM | POA: Diagnosis not present

## 2022-06-10 DIAGNOSIS — Z419 Encounter for procedure for purposes other than remedying health state, unspecified: Secondary | ICD-10-CM | POA: Diagnosis not present

## 2022-07-11 DIAGNOSIS — Z419 Encounter for procedure for purposes other than remedying health state, unspecified: Secondary | ICD-10-CM | POA: Diagnosis not present

## 2022-08-10 DIAGNOSIS — Z419 Encounter for procedure for purposes other than remedying health state, unspecified: Secondary | ICD-10-CM | POA: Diagnosis not present

## 2022-09-10 DIAGNOSIS — Z419 Encounter for procedure for purposes other than remedying health state, unspecified: Secondary | ICD-10-CM | POA: Diagnosis not present

## 2022-10-10 DIAGNOSIS — Z419 Encounter for procedure for purposes other than remedying health state, unspecified: Secondary | ICD-10-CM | POA: Diagnosis not present

## 2022-11-10 DIAGNOSIS — Z419 Encounter for procedure for purposes other than remedying health state, unspecified: Secondary | ICD-10-CM | POA: Diagnosis not present

## 2022-11-15 IMAGING — DX DG CHEST 2V
2 series · 2 of 2 positions shown · non-contrast
Comparison: 01/02/2018

CLINICAL DATA: Acute onset chest pain and shortness of breath
today.

EXAM:
CHEST - 2 VIEW

[chest pa]
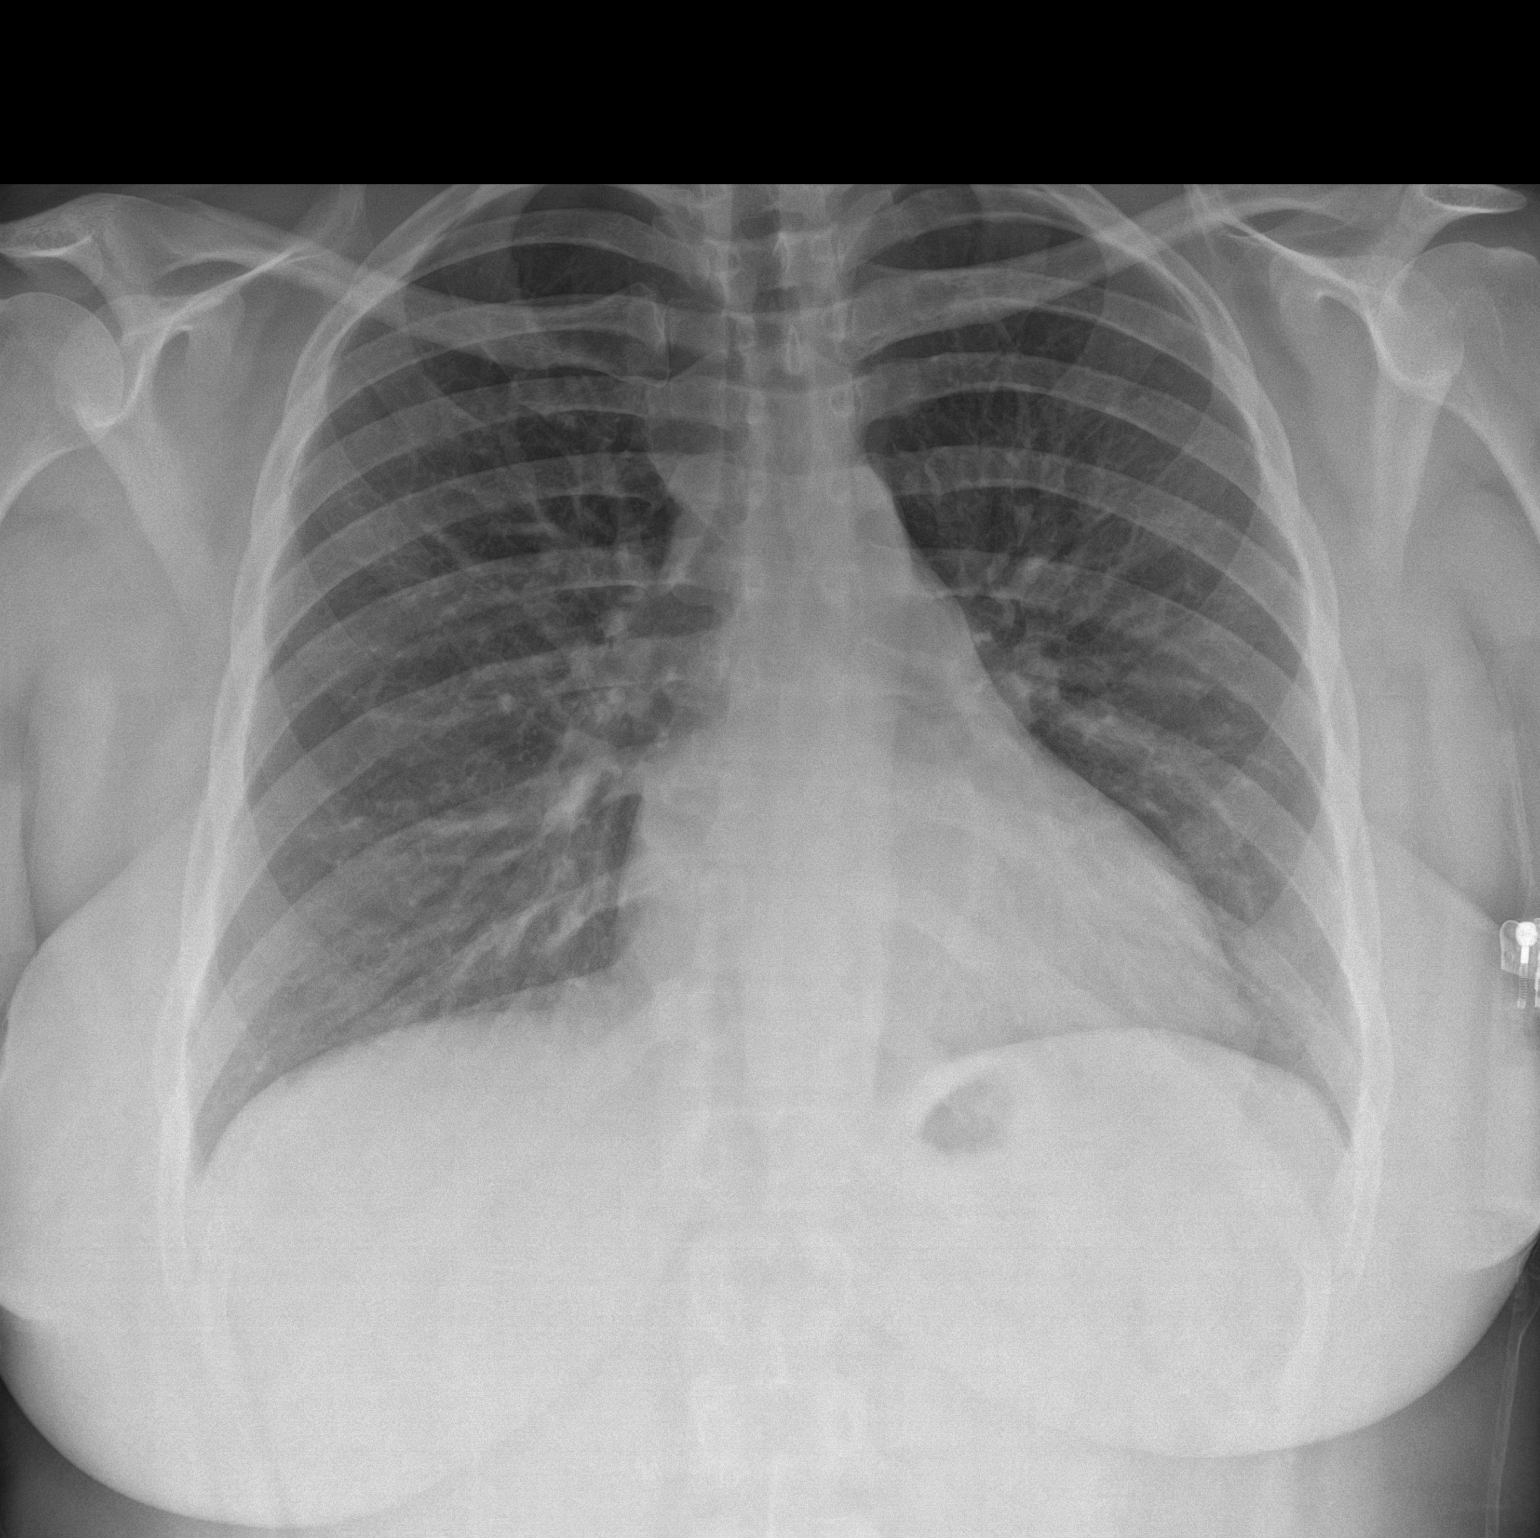

[chest lat]
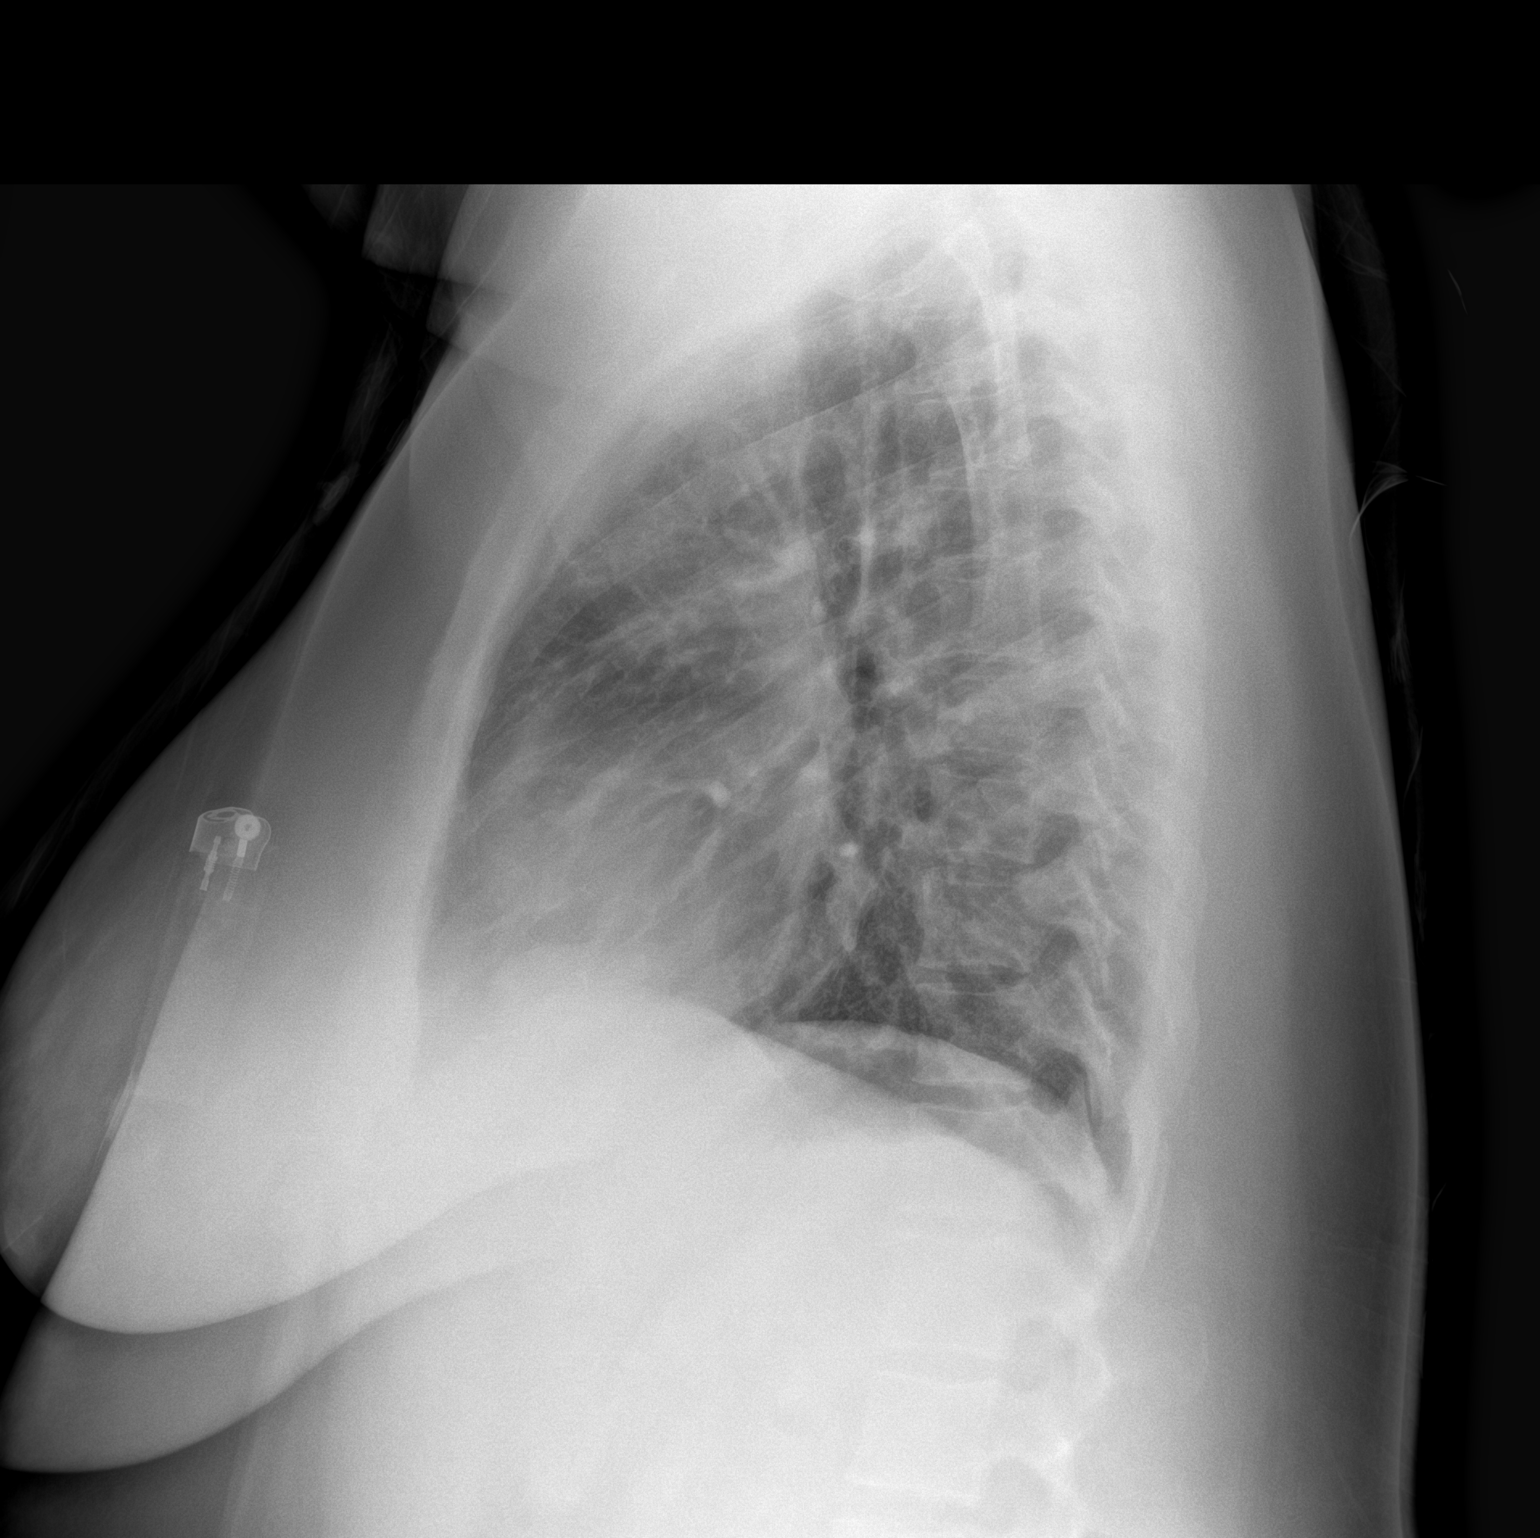

[2 of 2 positions shown; findings below may reference images not displayed]

FINDINGS: The heart size and mediastinal contours are within normal limits.
Both lungs are clear. The visualized skeletal structures are
unremarkable.
IMPRESSION: No active cardiopulmonary disease.

## 2022-11-20 ENCOUNTER — Emergency Department (HOSPITAL_BASED_OUTPATIENT_CLINIC_OR_DEPARTMENT_OTHER): Payer: BC Managed Care – PPO | Admitting: Radiology

## 2022-11-20 ENCOUNTER — Emergency Department (HOSPITAL_BASED_OUTPATIENT_CLINIC_OR_DEPARTMENT_OTHER)
Admission: EM | Admit: 2022-11-20 | Discharge: 2022-11-20 | Disposition: A | Discharge status: Home / Self Care | Payer: BC Managed Care – PPO | Source: Home / Self Care | Attending: Emergency Medicine | Admitting: Emergency Medicine

## 2022-11-20 ENCOUNTER — Telehealth (HOSPITAL_BASED_OUTPATIENT_CLINIC_OR_DEPARTMENT_OTHER): Payer: Self-pay | Admitting: Emergency Medicine

## 2022-11-20 ENCOUNTER — Other Ambulatory Visit: Payer: Self-pay

## 2022-11-20 DIAGNOSIS — S30861A Insect bite (nonvenomous) of abdominal wall, initial encounter: Secondary | ICD-10-CM | POA: Insufficient documentation

## 2022-11-20 DIAGNOSIS — E119 Type 2 diabetes mellitus without complications: Secondary | ICD-10-CM | POA: Diagnosis not present

## 2022-11-20 DIAGNOSIS — Z7984 Long term (current) use of oral hypoglycemic drugs: Secondary | ICD-10-CM | POA: Diagnosis not present

## 2022-11-20 DIAGNOSIS — R531 Weakness: Secondary | ICD-10-CM | POA: Diagnosis not present

## 2022-11-20 DIAGNOSIS — R7989 Other specified abnormal findings of blood chemistry: Secondary | ICD-10-CM

## 2022-11-20 DIAGNOSIS — R0602 Shortness of breath: Secondary | ICD-10-CM | POA: Diagnosis not present

## 2022-11-20 DIAGNOSIS — R0789 Other chest pain: Secondary | ICD-10-CM

## 2022-11-20 DIAGNOSIS — E876 Hypokalemia: Secondary | ICD-10-CM

## 2022-11-20 DIAGNOSIS — R112 Nausea with vomiting, unspecified: Secondary | ICD-10-CM | POA: Insufficient documentation

## 2022-11-20 DIAGNOSIS — W57XXXA Bitten or stung by nonvenomous insect and other nonvenomous arthropods, initial encounter: Secondary | ICD-10-CM

## 2022-11-20 LAB — CBC WITH DIFFERENTIAL/PLATELET
Abs Immature Granulocytes: 0.01 10*3/uL (ref 0.00–0.07)
Basophils Absolute: 0 10*3/uL (ref 0.0–0.1)
Basophils Relative: 0 %
Eosinophils Absolute: 0.1 10*3/uL (ref 0.0–0.5)
Eosinophils Relative: 2 %
HCT: 38.8 % (ref 36.0–46.0)
Hemoglobin: 13.5 g/dL (ref 12.0–15.0)
Immature Granulocytes: 0 %
Lymphocytes Relative: 39 %
Lymphs Abs: 1.3 10*3/uL (ref 0.7–4.0)
MCH: 34.2 pg — ABNORMAL HIGH (ref 26.0–34.0)
MCHC: 34.8 g/dL (ref 30.0–36.0)
MCV: 98.2 fL (ref 80.0–100.0)
Monocytes Absolute: 0.3 10*3/uL (ref 0.1–1.0)
Monocytes Relative: 9 %
Neutro Abs: 1.7 10*3/uL (ref 1.7–7.7)
Neutrophils Relative %: 50 %
Platelets: 165 10*3/uL (ref 150–400)
RBC: 3.95 MIL/uL (ref 3.87–5.11)
RDW: 12.5 % (ref 11.5–15.5)
WBC: 3.4 10*3/uL — ABNORMAL LOW (ref 4.0–10.5)
nRBC: 0 % (ref 0.0–0.2)

## 2022-11-20 LAB — COMPREHENSIVE METABOLIC PANEL
ALT: 93 U/L — ABNORMAL HIGH (ref 0–44)
AST: 125 U/L — ABNORMAL HIGH (ref 15–41)
Albumin: 3.9 g/dL (ref 3.5–5.0)
Alkaline Phosphatase: 104 U/L (ref 38–126)
Anion gap: 11 (ref 5–15)
BUN: 6 mg/dL (ref 6–20)
CO2: 30 mmol/L (ref 22–32)
Calcium: 9.5 mg/dL (ref 8.9–10.3)
Chloride: 95 mmol/L — ABNORMAL LOW (ref 98–111)
Creatinine, Ser: 0.64 mg/dL (ref 0.44–1.00)
GFR, Estimated: >60 mL/min (ref >60)
Glucose, Bld: 83 mg/dL (ref 70–99)
Potassium: 2.7 mmol/L — CL (ref 3.5–5.1)
Sodium: 136 mmol/L (ref 135–145)
Total Bilirubin: 2.5 mg/dL — ABNORMAL HIGH (ref 0.3–1.2)
Total Protein: 8.1 g/dL (ref 6.5–8.1)

## 2022-11-20 LAB — TROPONIN I (HIGH SENSITIVITY)
Troponin I (High Sensitivity): 3 ng/L (ref <18)
Troponin I (High Sensitivity): 3 ng/L (ref <18)

## 2022-11-20 LAB — LIPASE, BLOOD: Lipase: <10 U/L — ABNORMAL LOW (ref 11–51)

## 2022-11-20 LAB — D-DIMER, QUANTITATIVE: D-Dimer, Quant: 0.36 ug/mL-FEU (ref 0.00–0.50)

## 2022-11-20 MED ORDER — POTASSIUM CHLORIDE CRYS ER 20 MEQ PO TBCR
40.0000 meq | EXTENDED_RELEASE_TABLET | Freq: Once | ORAL | Status: AC
  Administered 2022-11-20: 40 meq via ORAL
  Filled 2022-11-20: qty 2

## 2022-11-20 MED ORDER — DOXYCYCLINE HYCLATE 100 MG PO CAPS: 100.0000 mg | ORAL_CAPSULE | Freq: Two times a day (BID) | ORAL | 0 refills | Status: AC

## 2022-11-20 MED ORDER — NAPROXEN 250 MG PO TABS
500.0000 mg | ORAL_TABLET | Freq: Once | ORAL | Status: AC
  Administered 2022-11-20: 500 mg via ORAL
  Filled 2022-11-20: qty 2

## 2022-11-20 MED ORDER — POTASSIUM CHLORIDE CRYS ER 20 MEQ PO TBCR: 20.0000 meq | EXTENDED_RELEASE_TABLET | Freq: Two times a day (BID) | ORAL | 0 refills | Status: DC

## 2022-11-20 MED ORDER — DOXYCYCLINE HYCLATE 100 MG PO CAPS: 100.0000 mg | ORAL_CAPSULE | Freq: Two times a day (BID) | ORAL | 0 refills | Status: DC

## 2022-11-20 MED ORDER — MAGNESIUM SULFATE 2 GM/50ML IV SOLN
2.0000 g | Freq: Once | INTRAVENOUS | Status: AC
  Administered 2022-11-20: 2 g via INTRAVENOUS
  Filled 2022-11-20: qty 50

## 2022-11-20 MED ORDER — POTASSIUM CHLORIDE CRYS ER 20 MEQ PO TBCR: 20.0000 meq | EXTENDED_RELEASE_TABLET | Freq: Two times a day (BID) | ORAL | 0 refills | Status: AC

## 2022-11-20 NOTE — ED Triage Notes (Signed)
Pt. States she was bitten by a brown recluse spider 2-3 weeks pta. Now c/o n/v chest pain and diff. Breathing.

## 2022-11-20 NOTE — ED Provider Notes (Signed)
  Physical Exam  BP (!) 154/101   Pulse 89   Resp (!) 26   SpO2 100%   Physical Exam  Procedures  Procedures  ED Course / MDM    Medical Decision Making Amount and/or Complexity of Data Reviewed Labs: ordered. Radiology: ordered.  Risk Prescription drug management.   I had assumed care of this patient from Dr. Manus Gunning.  Patient had come in with complaint of chest pain, shortness of breath, abdominal wall discomfort/burning pain.  Plan was to follow-up on the labs. Patient likely has cellulitis of the abdominal wall.  She has declined I&D for now.  Reassessment: Patient reassessed after the labs.  Dimer is negative.  Troponin is fine.  Patient's metabolic profile revealed hypokalemia with potassium of 2.7.  She also has slightly elevated LFTs and bilirubin.  Patient denies any abdominal pain on my assessment and has no abdominal tenderness.  She indicates that she has been drinking heavily for the last few months.  She has stopped drinking heavily in the past, and we will plan to do that again.  She has not had any significant withdrawal symptoms in the past.  I recommend that the hypokalemia is likely also associated with hypomagnesemia and her case in the setting of alcohol use disorder.  We have given her IV magnesium and oral potassium here.  Patient has declined hepatitis panel.  She says that she has insurance and will get a new PCP and get the LFTs rechecked.  Stable for discharge at this point.       Derwood Kaplan, MD 11/20/22 [phone]

## 2022-11-20 NOTE — Discharge Instructions (Addendum)
No evidence of heart attack or blood clot in the lung.  You declined incision and drainage of your skin lesion today.  Take the antibiotics as prescribed and use warm compresses.  Return for a wound check in 2 days with your primary doctor. Turn to ED sooner with spreading redness, fever, exertional chest pain, pain associate with shortness of breath, nausea, vomiting, sweating or other concerns.  Your serum came back slightly low.  We have given oral potassium supplement prescription.  Additionally, your liver enzymes are elevated.  It is prudent that you get your liver enzymes rechecked by a PCP in about 1 month.  Refrain from heavy alcohol use.

## 2022-11-20 NOTE — Telephone Encounter (Signed)
Patient is a questing that her pharmacy be changed to [facility] on [address].

## 2022-11-20 NOTE — ED Provider Notes (Signed)
[place] EMERGENCY DEPARTMENT AT [facility] Provider Note   CSN: [id number] Arrival date & time: 11/20/22  0549     History  Chief Complaint  Patient presents with   Chest Pain    Shelley Baldwin is a 27 y.o. female.  Patient with a history of diabetes who "weaned herself off medications" here with "brown recluse spider bite" to her abdomen.  States she noticed a "pimple to her lower abdomen about 2 to 3 weeks ago.  It gradually became redder and more painful and started draining yesterday.  She believes it was a brown recluse spider but did not see anything bite her and does not know how this wound began. Wound started draining yesterday while she was at work.  When she noticed this, she developed chest pain to the right chest, shortness of breath, nausea, vomiting and chills.  Chest pain is right-sided that comes and goes lasting for several minutes to hours at a time.  No radiation of the pain to her arm, neck or back.  Associate with some shortness of breath.  Vomiting x 4 yesterday but no diarrhea.  Chills but no fever.  No pain with urination or blood in the urine.  Denies any chest pain currently.  Denies any cardiac history. She admits that most of her symptoms started after she noted the wound was draining and she googled what the problem was. Smokes cigarettes but no other drugs.  No cardiac history. Does have Mirena IUD.  The history is provided by the patient.  Chest Pain Associated symptoms: nausea, shortness of breath, vomiting and weakness   Associated symptoms: no abdominal pain, no dizziness, no fever and no headache        Home Medications Prior to Admission medications   Medication Sig Start Date End Date Taking? Authorizing Provider  albuterol (PROVENTIL HFA;VENTOLIN HFA) 108 (90 Base) MCG/ACT inhaler Inhale 1-2 puffs into the lungs every 6 (six) hours as needed for wheezing or shortness of breath.    [provider]  escitalopram (LEXAPRO)  5 MG tablet Take 1 tablet (5 mg total) by mouth daily. 02/20/18   Micheal Likens, MD  hydrOXYzine (ATARAX/VISTARIL) 25 MG tablet Take 1 tablet (25 mg total) by mouth 3 (three) times daily as needed for anxiety. 02/19/18   Micheal Likens, MD  oxyCODONE-acetaminophen (PERCOCET/ROXICET) 5-325 MG tablet Take 1 tablet by mouth every 6 (six) hours as needed for severe pain. 07/07/21   Blue, Soijett A, PA-C  sitaGLIPtin-metformin (JANUMET) 50-1000 MG tablet Take 1 tablet by mouth 2 (two) times daily with a meal. Patient not taking: Reported on 02/16/2018 01/15/16   Dettinger, Elige Radon, MD  trimethoprim-polymyxin b (POLYTRIM) ophthalmic solution Place 1 drop into both eyes every 4 (four) hours. 07/07/21   Blue, Soijett A, PA-C      Allergies    Milk-related compounds    Review of Systems   Review of Systems  Constitutional:  Negative for activity change, appetite change and fever.  HENT:  Negative for congestion and rhinorrhea.   Respiratory:  Positive for chest tightness and shortness of breath.   Cardiovascular:  Positive for chest pain.  Gastrointestinal:  Positive for nausea and vomiting. Negative for abdominal pain.  Genitourinary:  Negative for dysuria and hematuria.  Musculoskeletal:  Negative for arthralgias and myalgias.  Skin:  Positive for wound. Negative for rash.  Neurological:  Positive for weakness. Negative for dizziness and headaches.   all other systems are negative except as noted  in the HPI and PMH.    Physical Exam Updated Vital Signs BP (!) 157/96 (BP Location: Right Arm)   Pulse (!) 123   Resp 20   SpO2 100%  Physical Exam Vitals and nursing note reviewed.  Constitutional:      General: She is not in acute distress.    Appearance: She is well-developed.  HENT:     Head: Normocephalic and atraumatic.     Mouth/Throat:     Pharynx: No oropharyngeal exudate.  Eyes:     Conjunctiva/sclera: Conjunctivae normal.     Pupils: Pupils are equal, round,  and reactive to light.  Neck:     Comments: No meningismus. Cardiovascular:     Rate and Rhythm: Normal rate and regular rhythm.     Heart sounds: Normal heart sounds. No murmur heard. Pulmonary:     Effort: Pulmonary effort is normal. No respiratory distress.     Breath sounds: Normal breath sounds.  Chest:     Chest wall: No tenderness.  Abdominal:     Palpations: Abdomen is soft.     Tenderness: There is no abdominal tenderness. There is no guarding or rebound.     Comments: 1 cm area of erythema and induration to pannus as depicted.  No fluctuance. Chaperone present, Amy NT.  Musculoskeletal:        General: No tenderness. Normal range of motion.     Cervical back: Normal range of motion and neck supple.  Skin:    General: Skin is warm.  Neurological:     Mental Status: She is alert and oriented to person, place, and time.     Cranial Nerves: No cranial nerve deficit.     Motor: No abnormal muscle tone.     Coordination: Coordination normal.     Comments:  5/5 strength throughout. CN 2-12 intact.Equal grip strength.   Psychiatric:        Behavior: Behavior normal.     ED Results / Procedures / Treatments   Labs (all labs ordered are listed, but only abnormal results are displayed) Labs Reviewed  CBC WITH DIFFERENTIAL/PLATELET - Abnormal; Notable for the following components:      Result Value   WBC 3.4 (*)    MCH 34.2 (*)    All other components within normal limits  COMPREHENSIVE METABOLIC PANEL  D-DIMER, QUANTITATIVE  LIPASE, BLOOD  URINALYSIS, ROUTINE W REFLEX MICROSCOPIC  PREGNANCY, URINE  TROPONIN I (HIGH SENSITIVITY)    EKG EKG Interpretation Date/Time:  Sunday November 20 2022 06:02:18 EDT Ventricular Rate:  92 PR Interval:  130 QRS Duration:  102 QT Interval:  375 QTC Calculation: 464 R Axis:   17  Text Interpretation: Sinus rhythm Nonspecific T abnormalities, diffuse leads T wave inversions similar to 2017 Confirmed by Glynn Octave [phone]) on  11/20/2022 6:08:50 AM  Radiology No results found.  Procedures Procedures    Medications Ordered in ED Medications - No data to display  ED Course/ Medical Decision Making/ A&P                                 Medical Decision Making Amount and/or Complexity of Data Reviewed Labs: ordered. Decision-making details documented in ED Course. Radiology: ordered and independent interpretation performed. Decision-making details documented in ED Course. ECG/medicine tests: ordered and independent interpretation performed. Decision-making details documented in ED Course.  Risk Prescription drug management.   "Spider bite" to lower abdomen for several  weeks now draining.  Developed chest pain, shortness of breath, nausea, vomiting and chills after this wound started draining.  No chest pain or shortness of breath now.  Tachycardic on arrival but not hypoxic.  EKG is sinus rhythm with T wave inversions similar to previous.  Wound seems consistent with possible insect bite.  Discussed with patient cannot rule out spider bite brown recluse or otherwise.  Does have some induration but no fluctuance.  Incision and drainage discussed with patient but she prefers to defer at this time and agrees with warm compresses and antibiotics.  Check labs including D-dimer given her tachycardia with birth control use.  Chest x-ray and labs pending at shift change.  Low suspicion for ACS or PE.  Anticipate discharge home pending laboratory studies. Will give antibiotics for soft tissue skin infection.  Recommend warm compresses.  She declines I&D today.  Recommend wound check with PCP in 2 days.  Care to be transferred at shift change.       Final Clinical Impression(s) / ED Diagnoses Final diagnoses:  Insect bite, unspecified site, initial encounter  Atypical chest pain    Rx / DC Orders ED Discharge Orders     None         Jarid Sasso, Jeannett Senior, MD 11/20/22 [phone]

## 2022-12-11 DIAGNOSIS — Z419 Encounter for procedure for purposes other than remedying health state, unspecified: Secondary | ICD-10-CM | POA: Diagnosis not present

## 2023-01-10 DIAGNOSIS — Z419 Encounter for procedure for purposes other than remedying health state, unspecified: Secondary | ICD-10-CM | POA: Diagnosis not present

## 2023-02-10 DIAGNOSIS — Z419 Encounter for procedure for purposes other than remedying health state, unspecified: Secondary | ICD-10-CM | POA: Diagnosis not present

## 2023-03-12 DIAGNOSIS — Z419 Encounter for procedure for purposes other than remedying health state, unspecified: Secondary | ICD-10-CM | POA: Diagnosis not present

## 2023-04-12 DIAGNOSIS — Z419 Encounter for procedure for purposes other than remedying health state, unspecified: Secondary | ICD-10-CM | POA: Diagnosis not present

## 2023-05-09 ENCOUNTER — Other Ambulatory Visit: Payer: Self-pay

## 2023-05-09 ENCOUNTER — Emergency Department (HOSPITAL_BASED_OUTPATIENT_CLINIC_OR_DEPARTMENT_OTHER)
Admission: EM | Admit: 2023-05-09 | Discharge: 2023-05-09 | Disposition: A | Discharge status: Home / Self Care | Payer: BC Managed Care – PPO | Attending: Emergency Medicine | Admitting: Emergency Medicine

## 2023-05-09 DIAGNOSIS — R7401 Elevation of levels of liver transaminase levels: Secondary | ICD-10-CM | POA: Insufficient documentation

## 2023-05-09 DIAGNOSIS — Z20822 Contact with and (suspected) exposure to covid-19: Secondary | ICD-10-CM | POA: Insufficient documentation

## 2023-05-09 DIAGNOSIS — R112 Nausea with vomiting, unspecified: Secondary | ICD-10-CM | POA: Diagnosis present

## 2023-05-09 DIAGNOSIS — E119 Type 2 diabetes mellitus without complications: Secondary | ICD-10-CM | POA: Insufficient documentation

## 2023-05-09 DIAGNOSIS — K219 Gastro-esophageal reflux disease without esophagitis: Secondary | ICD-10-CM | POA: Diagnosis not present

## 2023-05-09 LAB — URINALYSIS, ROUTINE W REFLEX MICROSCOPIC
Bacteria, UA: NONE SEEN
Bilirubin Urine: NEGATIVE
Glucose, UA: NEGATIVE mg/dL
Hgb urine dipstick: NEGATIVE
Ketones, ur: 15 mg/dL — AB
Leukocytes,Ua: NEGATIVE
Nitrite: NEGATIVE
Protein, ur: 30 mg/dL — AB
Specific Gravity, Urine: 1.017 (ref 1.005–1.030)
pH: 8.5 — ABNORMAL HIGH (ref 5.0–8.0)

## 2023-05-09 LAB — CBC
HCT: 42.3 % (ref 36.0–46.0)
Hemoglobin: 14.6 g/dL (ref 12.0–15.0)
MCH: 34.6 pg — ABNORMAL HIGH (ref 26.0–34.0)
MCHC: 34.5 g/dL (ref 30.0–36.0)
MCV: 100.2 fL — ABNORMAL HIGH (ref 80.0–100.0)
Platelets: 168 10*3/uL (ref 150–400)
RBC: 4.22 MIL/uL (ref 3.87–5.11)
RDW: 13.4 % (ref 11.5–15.5)
WBC: 3.7 10*3/uL — ABNORMAL LOW (ref 4.0–10.5)
nRBC: 0 % (ref 0.0–0.2)

## 2023-05-09 LAB — COMPREHENSIVE METABOLIC PANEL
ALT: 69 U/L — ABNORMAL HIGH (ref 0–44)
AST: 123 U/L — ABNORMAL HIGH (ref 15–41)
Albumin: 3.8 g/dL (ref 3.5–5.0)
Alkaline Phosphatase: 91 U/L (ref 38–126)
Anion gap: 11 (ref 5–15)
BUN: <5 mg/dL — ABNORMAL LOW (ref 6–20)
CO2: 26 mmol/L (ref 22–32)
Calcium: 9.3 mg/dL (ref 8.9–10.3)
Chloride: 99 mmol/L (ref 98–111)
Creatinine, Ser: 0.56 mg/dL (ref 0.44–1.00)
GFR, Estimated: >60 mL/min (ref >60)
Glucose, Bld: 96 mg/dL (ref 70–99)
Potassium: 3.7 mmol/L (ref 3.5–5.1)
Sodium: 136 mmol/L (ref 135–145)
Total Bilirubin: 1.3 mg/dL — ABNORMAL HIGH (ref 0.0–1.2)
Total Protein: 8.2 g/dL — ABNORMAL HIGH (ref 6.5–8.1)

## 2023-05-09 LAB — LIPASE, BLOOD: Lipase: <10 U/L — ABNORMAL LOW (ref 11–51)

## 2023-05-09 LAB — RESP PANEL BY RT-PCR (RSV, FLU A&B, COVID)  RVPGX2
Influenza A by PCR: NEGATIVE
Influenza B by PCR: NEGATIVE
Resp Syncytial Virus by PCR: NEGATIVE
SARS Coronavirus 2 by RT PCR: NEGATIVE

## 2023-05-09 LAB — HCG, SERUM, QUALITATIVE: Preg, Serum: NEGATIVE

## 2023-05-09 LAB — CBG MONITORING, ED: Glucose-Capillary: 88 mg/dL (ref 70–99)

## 2023-05-09 MED ORDER — ALUM & MAG HYDROXIDE-SIMETH 200-200-20 MG/5ML PO SUSP
30.0000 mL | Freq: Once | ORAL | Status: AC
  Administered 2023-05-09: 30 mL via ORAL
  Filled 2023-05-09: qty 30

## 2023-05-09 MED ORDER — ONDANSETRON HCL 4 MG/2ML IJ SOLN
4.0000 mg | Freq: Once | INTRAMUSCULAR | Status: AC | PRN
  Administered 2023-05-09: 4 mg via INTRAVENOUS
  Filled 2023-05-09: qty 2

## 2023-05-09 MED ORDER — LIDOCAINE VISCOUS HCL 2 % MT SOLN
15.0000 mL | Freq: Once | OROMUCOSAL | Status: AC
  Administered 2023-05-09: 15 mL via ORAL
  Filled 2023-05-09: qty 15

## 2023-05-09 MED ORDER — ONDANSETRON HCL 4 MG PO TABS: 4.0000 mg | ORAL_TABLET | Freq: Three times a day (TID) | ORAL | 0 refills | Status: AC | PRN

## 2023-05-09 NOTE — ED Triage Notes (Signed)
Sudden onset of epigastric pain. N/V. Last BM today. Afebrile. Denies urinary symptoms. HX diabetes.

## 2023-05-09 NOTE — ED Notes (Signed)
RN reviewed discharge instructions with pt. Pt verbalized understanding and had no further questions. VSS upon discharge.

## 2023-05-09 NOTE — ED Provider Notes (Signed)
[place] EMERGENCY DEPARTMENT AT [facility] Provider Note   CSN: [id number] Arrival date & time: 05/09/23  1634     History  No chief complaint on file.   Shelley Baldwin is a 28 y.o. female past medical history significant for diabetes presents today for nausea, vomiting, and epigastric pain.  Patient states that she began having the symptoms around 4:30 PM today.  Patient denies fever, chills, body aches, shortness of breath, chest pain, diarrhea, or headache.  Patient states that this feels like her usual acid reflux much more painful.  HPI     Home Medications Prior to Admission medications   Medication Sig Start Date End Date Taking? Authorizing Provider  ondansetron (ZOFRAN) 4 MG tablet Take 1 tablet (4 mg total) by mouth every 8 (eight) hours as needed for nausea or vomiting. 05/09/23  Yes Dolphus Jenny, PA-C  albuterol (PROVENTIL HFA;VENTOLIN HFA) 108 (90 Base) MCG/ACT inhaler Inhale 1-2 puffs into the lungs every 6 (six) hours as needed for wheezing or shortness of breath.    [provider]  doxycycline (VIBRAMYCIN) 100 MG capsule Take 1 capsule (100 mg total) by mouth 2 (two) times daily. 11/20/22   Derwood Kaplan, MD  escitalopram (LEXAPRO) 5 MG tablet Take 1 tablet (5 mg total) by mouth daily. 02/20/18   Micheal Likens, MD  hydrOXYzine (ATARAX/VISTARIL) 25 MG tablet Take 1 tablet (25 mg total) by mouth 3 (three) times daily as needed for anxiety. 02/19/18   Micheal Likens, MD  oxyCODONE-acetaminophen (PERCOCET/ROXICET) 5-325 MG tablet Take 1 tablet by mouth every 6 (six) hours as needed for severe pain. 07/07/21   Blue, Soijett A, PA-C  potassium chloride SA (KLOR-CON M) 20 MEQ tablet Take 1 tablet (20 mEq total) by mouth 2 (two) times daily. 11/20/22   Derwood Kaplan, MD  sitaGLIPtin-metformin (JANUMET) 50-1000 MG tablet Take 1 tablet by mouth 2 (two) times daily with a meal. Patient not taking: Reported on 02/16/2018 01/15/16    Dettinger, Elige Radon, MD  trimethoprim-polymyxin b (POLYTRIM) ophthalmic solution Place 1 drop into both eyes every 4 (four) hours. 07/07/21   Blue, Soijett A, PA-C      Allergies    Milk-related compounds    Review of Systems   Review of Systems  Gastrointestinal:  Positive for abdominal pain, nausea and vomiting.    Physical Exam Updated Vital Signs BP 119/78   Pulse 61   Temp 97.9 F (36.6 C) (Oral)   Resp (!) 24   SpO2 100%  Physical Exam  ED Results / Procedures / Treatments   Labs (all labs ordered are listed, but only abnormal results are displayed) Labs Reviewed  LIPASE, BLOOD - Abnormal; Notable for the following components:      Result Value   Lipase <10 (*)    All other components within normal limits  COMPREHENSIVE METABOLIC PANEL - Abnormal; Notable for the following components:   BUN <5 (*)    Total Protein 8.2 (*)    AST 123 (*)    ALT 69 (*)    Total Bilirubin 1.3 (*)    All other components within normal limits  CBC - Abnormal; Notable for the following components:   WBC 3.7 (*)    MCV 100.2 (*)    MCH 34.6 (*)    All other components within normal limits  URINALYSIS, ROUTINE W REFLEX MICROSCOPIC - Abnormal; Notable for the following components:   pH 8.5 (*)    Ketones, ur 15 (*)  Protein, ur 30 (*)    All other components within normal limits  RESP PANEL BY RT-PCR (RSV, FLU A&B, COVID)  RVPGX2  HCG, SERUM, QUALITATIVE  CBG MONITORING, ED    EKG None  Radiology No results found.  Procedures Procedures    Medications Ordered in ED Medications  ondansetron (ZOFRAN) injection 4 mg (4 mg Intravenous Given 05/09/23 1651)  alum & mag hydroxide-simeth (MAALOX/MYLANTA) 200-200-20 MG/5ML suspension 30 mL (30 mLs Oral Given 05/09/23 1741)    And  lidocaine (XYLOCAINE) 2 % viscous mouth solution 15 mL (15 mLs Oral Given 05/09/23 1741)    ED Course/ Medical Decision Making/ A&P Clinical Course as of 05/09/23 1818  Tue May 09, 2023  1811  Stable  Abdominal pain 27 YOF AOC symptoms. Mild hepatitis. AOC findings.  [CC]    Clinical Course User Index [CC] Glyn Ade, MD                                 Medical Decision Making Amount and/or Complexity of Data Reviewed Labs: ordered.  Risk Prescription drug management.   This patient presents to the ED with chief complaint(s) of abdominal pain with nausea and vomiting with pertinent past medical history of diabetes which further complicates the presenting complaint. The complaint involves an extensive differential diagnosis and also carries with it a high risk of complications and morbidity.    The differential diagnosis includes hyperglycemia, pregnancy, pancreatitis, choledocholithiasis, COVID, flu, RSV  Additional history obtained: Records reviewed Primary Care Documents  ED Course and Reassessment: GI cocktail ordered  Independent labs interpretation:  The following labs were independently interpreted:  CBC: Mild leukopenia CMP: Elevated AST at 123, elevated ALT at 69, mildly elevated total bili at 1.3 all of which are not new per historical values Lipase: Less than 10 Serum hCG: Negative UA: 15 ketones, elevated pH at 8.5, 30 protein Respiratory panel: Negative  Consultation: - Consulted or discussed management/test interpretation w/ external professional: None  Consideration for admission or further workup: Considered for mission further workup however patient's vital signs, physical exam, and labs were reassuring.  Patient able to tolerate p.o. intake prior to discharge.  Patient will be discharged with Zofran as needed for nausea and vomiting.  Patient was found to have transaminitis while in the ED and has been advised to follow-up with primary care physician to trend her liver enzymes and to cease drinking as this could be the cause.        Final Clinical Impression(s) / ED Diagnoses Final diagnoses:  Nausea and vomiting, unspecified vomiting  type  Gastroesophageal reflux disease, unspecified whether esophagitis present  Transaminitis    Rx / DC Orders ED Discharge Orders          Ordered    ondansetron (ZOFRAN) 4 MG tablet  Every 8 hours PRN        05/09/23 1815              Dolphus Jenny, PA-C 05/09/23 1818    Glyn Ade, MD 05/16/23 1458

## 2023-05-09 NOTE — Discharge Instructions (Signed)
Today you are seen for nausea and vomiting.  You may take over-the-counter Maalox for acid reflux as needed.  Please pick up your Zofran and take for nausea and vomiting as needed.  Please follow-up with your primary care physician as your liver enzymes are found to be elevated while in the ED.  Thank you for letting us treat you today. After performing physical exam and reviewing your labs, I feel you are safe to go home. Please follow up with your PCP in the next several days and provide them with your records from this visit. Return to the Emergency Room if pain becomes severe or symptoms worsen.

## 2023-05-13 DIAGNOSIS — Z419 Encounter for procedure for purposes other than remedying health state, unspecified: Secondary | ICD-10-CM | POA: Diagnosis not present

## 2023-06-10 DIAGNOSIS — Z419 Encounter for procedure for purposes other than remedying health state, unspecified: Secondary | ICD-10-CM | POA: Diagnosis not present

## 2023-07-22 DIAGNOSIS — Z419 Encounter for procedure for purposes other than remedying health state, unspecified: Secondary | ICD-10-CM | POA: Diagnosis not present
